# Patient Record
Sex: Male | Born: 1960
Health system: Southern US, Community
[De-identification: ages and names within clinical notes are randomized; demographics above are authoritative.]

## PROBLEM LIST (undated history)

## (undated) DIAGNOSIS — K219 Gastro-esophageal reflux disease without esophagitis: Secondary | ICD-10-CM

## (undated) DIAGNOSIS — M51379 Other intervertebral disc degeneration, lumbosacral region without mention of lumbar back pain or lower extremity pain: Secondary | ICD-10-CM

## (undated) DIAGNOSIS — I10 Essential (primary) hypertension: Secondary | ICD-10-CM

## (undated) DIAGNOSIS — F32A Depression, unspecified: Secondary | ICD-10-CM

## (undated) DIAGNOSIS — N471 Phimosis: Secondary | ICD-10-CM

## (undated) DIAGNOSIS — M1990 Unspecified osteoarthritis, unspecified site: Secondary | ICD-10-CM

## (undated) DIAGNOSIS — F419 Anxiety disorder, unspecified: Secondary | ICD-10-CM

## (undated) DIAGNOSIS — M5137 Other intervertebral disc degeneration, lumbosacral region: Secondary | ICD-10-CM

## (undated) DIAGNOSIS — K5730 Diverticulosis of large intestine without perforation or abscess without bleeding: Secondary | ICD-10-CM

## (undated) DIAGNOSIS — Z973 Presence of spectacles and contact lenses: Secondary | ICD-10-CM

## (undated) DIAGNOSIS — E119 Type 2 diabetes mellitus without complications: Secondary | ICD-10-CM

## (undated) DIAGNOSIS — E785 Hyperlipidemia, unspecified: Secondary | ICD-10-CM

## (undated) DIAGNOSIS — Z8673 Personal history of transient ischemic attack (TIA), and cerebral infarction without residual deficits: Secondary | ICD-10-CM

## (undated) HISTORY — DX: Gastro-esophageal reflux disease without esophagitis: K21.9

## (undated) HISTORY — DX: Anxiety disorder, unspecified: F41.9

## (undated) HISTORY — DX: Unspecified osteoarthritis, unspecified site: M19.90

---

## 1989-08-10 HISTORY — PX: OTHER SURGICAL HISTORY: SHX169

## 1990-08-10 HISTORY — PX: KNEE ARTHROSCOPY: SUR90

## 2004-08-10 DIAGNOSIS — S93409A Sprain of unspecified ligament of unspecified ankle, initial encounter: Secondary | ICD-10-CM | POA: Insufficient documentation

## 2006-07-06 ENCOUNTER — Emergency Department (HOSPITAL_COMMUNITY): Admission: EM | Admit: 2006-07-06 | Discharge: 2006-07-06 | Payer: Self-pay | Admitting: Family Medicine

## 2007-11-13 ENCOUNTER — Emergency Department (HOSPITAL_COMMUNITY): Admission: EM | Admit: 2007-11-13 | Discharge: 2007-11-13 | Payer: Self-pay | Admitting: Family Medicine

## 2008-05-03 ENCOUNTER — Ambulatory Visit: Payer: Self-pay | Admitting: Internal Medicine

## 2008-05-03 DIAGNOSIS — E1165 Type 2 diabetes mellitus with hyperglycemia: Secondary | ICD-10-CM

## 2008-05-03 DIAGNOSIS — I10 Essential (primary) hypertension: Secondary | ICD-10-CM | POA: Insufficient documentation

## 2008-05-03 LAB — CONVERTED CEMR LAB
Glucose, Urine, Semiquant: >=1000
Hgb A1c MFr Bld: 11.5 %
Ketones, urine, test strip: NEGATIVE
Protein, U semiquant: 100
Urobilinogen, UA: 0.2
WBC Urine, dipstick: NEGATIVE

## 2008-05-08 ENCOUNTER — Ambulatory Visit: Payer: Self-pay | Admitting: Internal Medicine

## 2008-05-13 LAB — CONVERTED CEMR LAB
Albumin: 4.6 g/dL (ref 3.5–5.2)
Alkaline Phosphatase: 80 units/L (ref 39–117)
BUN: 12 mg/dL (ref 6–23)
Chloride: 100 meq/L (ref 96–112)
Cholesterol: 275 mg/dL — ABNORMAL HIGH (ref 0–200)
Creatinine, Ser: 0.95 mg/dL (ref 0.40–1.50)
Glucose, Bld: 315 mg/dL — ABNORMAL HIGH (ref 70–99)
Potassium: 4.5 meq/L (ref 3.5–5.3)
Sodium: 139 meq/L (ref 135–145)
Total Bilirubin: 0.5 mg/dL (ref 0.3–1.2)

## 2008-05-17 ENCOUNTER — Ambulatory Visit: Payer: Self-pay | Admitting: Internal Medicine

## 2008-05-17 LAB — CONVERTED CEMR LAB
BUN: 10 mg/dL (ref 6–23)
CO2: 23 meq/L (ref 19–32)
Calcium: 9.2 mg/dL (ref 8.4–10.5)
Creatinine, Ser: 0.86 mg/dL (ref 0.40–1.50)
Glucose, Bld: 319 mg/dL — ABNORMAL HIGH (ref 70–99)
Potassium: 4.4 meq/L (ref 3.5–5.3)

## 2008-05-31 ENCOUNTER — Ambulatory Visit: Payer: Self-pay | Admitting: Nurse Practitioner

## 2008-09-07 ENCOUNTER — Ambulatory Visit: Payer: Self-pay | Admitting: Internal Medicine

## 2008-09-11 ENCOUNTER — Ambulatory Visit: Payer: Self-pay | Admitting: Internal Medicine

## 2008-09-11 LAB — CONVERTED CEMR LAB
Protein, U semiquant: 100
Specific Gravity, Urine: 1.02
WBC Urine, dipstick: NEGATIVE
pH: 5.5

## 2008-09-12 ENCOUNTER — Encounter (INDEPENDENT_AMBULATORY_CARE_PROVIDER_SITE_OTHER): Payer: Self-pay | Admitting: Internal Medicine

## 2008-09-24 LAB — CONVERTED CEMR LAB
ALT: 97 units/L — ABNORMAL HIGH (ref 0–53)
Albumin: 4.6 g/dL (ref 3.5–5.2)
Basophils Absolute: 0 10*3/uL (ref 0.0–0.1)
CO2: 21 meq/L (ref 19–32)
HDL: 48 mg/dL (ref >39)
Hemoglobin: 16.1 g/dL (ref 13.0–17.0)
Lymphocytes Relative: 32 % (ref 12–46)
MCHC: 32.7 g/dL (ref 30.0–36.0)
MCV: 95.2 fL (ref 78.0–100.0)
Monocytes Absolute: 0.5 10*3/uL (ref 0.1–1.0)
Neutrophils Relative %: 58 % (ref 43–77)
Potassium: 5 meq/L (ref 3.5–5.3)
RBC: 5.17 M/uL (ref 4.22–5.81)
RDW: 12.3 % (ref 11.5–15.5)
Sodium: 138 meq/L (ref 135–145)
Total Bilirubin: 0.8 mg/dL (ref 0.3–1.2)
Total CHOL/HDL Ratio: 7.6
Total Protein: 8.1 g/dL (ref 6.0–8.3)
WBC: 6 10*3/uL (ref 4.0–10.5)

## 2008-10-23 ENCOUNTER — Ambulatory Visit: Payer: Self-pay | Admitting: Internal Medicine

## 2008-10-23 DIAGNOSIS — Z8639 Personal history of other endocrine, nutritional and metabolic disease: Secondary | ICD-10-CM

## 2008-10-23 DIAGNOSIS — Z862 Personal history of diseases of the blood and blood-forming organs and certain disorders involving the immune mechanism: Secondary | ICD-10-CM | POA: Insufficient documentation

## 2008-10-23 DIAGNOSIS — E782 Mixed hyperlipidemia: Secondary | ICD-10-CM | POA: Insufficient documentation

## 2008-10-23 LAB — CONVERTED CEMR LAB
Blood Glucose, Fingerstick: 394
OCCULT 1: NEGATIVE
OCCULT 2: NEGATIVE
OCCULT 3: NEGATIVE

## 2009-02-12 ENCOUNTER — Ambulatory Visit: Payer: Self-pay | Admitting: Internal Medicine

## 2009-02-12 LAB — CONVERTED CEMR LAB: Blood Glucose, AC Bkfst: 364 mg/dL

## 2009-02-19 ENCOUNTER — Encounter (INDEPENDENT_AMBULATORY_CARE_PROVIDER_SITE_OTHER): Payer: Self-pay | Admitting: Internal Medicine

## 2009-02-20 ENCOUNTER — Encounter (INDEPENDENT_AMBULATORY_CARE_PROVIDER_SITE_OTHER): Payer: Self-pay | Admitting: Internal Medicine

## 2009-03-07 ENCOUNTER — Encounter (INDEPENDENT_AMBULATORY_CARE_PROVIDER_SITE_OTHER): Payer: Self-pay | Admitting: Internal Medicine

## 2009-03-07 ENCOUNTER — Ambulatory Visit: Payer: Self-pay | Admitting: Internal Medicine

## 2009-03-11 ENCOUNTER — Ambulatory Visit (HOSPITAL_COMMUNITY): Admission: RE | Admit: 2009-03-11 | Discharge: 2009-03-11 | Payer: Self-pay | Admitting: Internal Medicine

## 2009-03-31 DIAGNOSIS — M1610 Unilateral primary osteoarthritis, unspecified hip: Secondary | ICD-10-CM | POA: Insufficient documentation

## 2009-03-31 DIAGNOSIS — M169 Osteoarthritis of hip, unspecified: Secondary | ICD-10-CM | POA: Insufficient documentation

## 2009-04-17 ENCOUNTER — Encounter (INDEPENDENT_AMBULATORY_CARE_PROVIDER_SITE_OTHER): Payer: Self-pay | Admitting: *Deleted

## 2009-06-19 ENCOUNTER — Ambulatory Visit: Payer: Self-pay | Admitting: *Deleted

## 2009-09-02 ENCOUNTER — Telehealth (INDEPENDENT_AMBULATORY_CARE_PROVIDER_SITE_OTHER): Payer: Self-pay | Admitting: Internal Medicine

## 2009-09-06 ENCOUNTER — Ambulatory Visit: Payer: Self-pay | Admitting: Internal Medicine

## 2009-09-06 DIAGNOSIS — F1010 Alcohol abuse, uncomplicated: Secondary | ICD-10-CM | POA: Insufficient documentation

## 2009-09-06 LAB — CONVERTED CEMR LAB: Blood Glucose, Fingerstick: 154

## 2009-09-09 ENCOUNTER — Encounter (INDEPENDENT_AMBULATORY_CARE_PROVIDER_SITE_OTHER): Payer: Self-pay | Admitting: Internal Medicine

## 2009-09-20 ENCOUNTER — Ambulatory Visit: Payer: Self-pay | Admitting: Internal Medicine

## 2009-09-25 ENCOUNTER — Ambulatory Visit: Payer: Self-pay | Admitting: Physician Assistant

## 2009-10-12 ENCOUNTER — Encounter (INDEPENDENT_AMBULATORY_CARE_PROVIDER_SITE_OTHER): Payer: Self-pay | Admitting: Internal Medicine

## 2009-10-12 LAB — CONVERTED CEMR LAB
ALT: 54 units/L — ABNORMAL HIGH (ref 0–53)
Albumin: 4.6 g/dL (ref 3.5–5.2)
Alkaline Phosphatase: 59 units/L (ref 39–117)
Bilirubin, Direct: 0.2 mg/dL (ref 0.0–0.3)
Cholesterol: 189 mg/dL (ref 0–200)
HDL: 47 mg/dL (ref >39)
LDL Cholesterol: 118 mg/dL — ABNORMAL HIGH (ref 0–99)
Total Bilirubin: 0.8 mg/dL (ref 0.3–1.2)
Total Protein: 8 g/dL (ref 6.0–8.3)
VLDL: 24 mg/dL (ref 0–40)

## 2009-11-12 ENCOUNTER — Ambulatory Visit: Payer: Self-pay | Admitting: Internal Medicine

## 2009-11-12 DIAGNOSIS — M2550 Pain in unspecified joint: Secondary | ICD-10-CM

## 2009-11-15 ENCOUNTER — Encounter (INDEPENDENT_AMBULATORY_CARE_PROVIDER_SITE_OTHER): Payer: Self-pay | Admitting: Internal Medicine

## 2009-11-15 LAB — CONVERTED CEMR LAB
Anti Nuclear Antibody(ANA): POSITIVE — AB
Rhuematoid fact SerPl-aCnc: 33 intl units/mL — ABNORMAL HIGH (ref 0–20)

## 2009-11-18 ENCOUNTER — Ambulatory Visit (HOSPITAL_COMMUNITY): Admission: RE | Admit: 2009-11-18 | Discharge: 2009-11-18 | Payer: Self-pay | Admitting: Internal Medicine

## 2009-11-21 ENCOUNTER — Encounter (INDEPENDENT_AMBULATORY_CARE_PROVIDER_SITE_OTHER): Payer: Self-pay | Admitting: Internal Medicine

## 2009-11-21 ENCOUNTER — Telehealth (INDEPENDENT_AMBULATORY_CARE_PROVIDER_SITE_OTHER): Payer: Self-pay | Admitting: Internal Medicine

## 2009-11-23 ENCOUNTER — Encounter (INDEPENDENT_AMBULATORY_CARE_PROVIDER_SITE_OTHER): Payer: Self-pay | Admitting: Internal Medicine

## 2009-11-23 ENCOUNTER — Telehealth (INDEPENDENT_AMBULATORY_CARE_PROVIDER_SITE_OTHER): Payer: Self-pay | Admitting: Internal Medicine

## 2009-11-23 DIAGNOSIS — D169 Benign neoplasm of bone and articular cartilage, unspecified: Secondary | ICD-10-CM | POA: Insufficient documentation

## 2009-11-23 LAB — CONVERTED CEMR LAB: Scleroderma (Scl-70) (ENA) Antibody, IgG: <0.2 (ref <1.0)

## 2009-12-11 ENCOUNTER — Encounter (INDEPENDENT_AMBULATORY_CARE_PROVIDER_SITE_OTHER): Payer: Self-pay | Admitting: Internal Medicine

## 2009-12-31 ENCOUNTER — Ambulatory Visit: Payer: Self-pay | Admitting: Internal Medicine

## 2009-12-31 DIAGNOSIS — M1710 Unilateral primary osteoarthritis, unspecified knee: Secondary | ICD-10-CM

## 2010-01-04 ENCOUNTER — Emergency Department (HOSPITAL_COMMUNITY): Admission: EM | Admit: 2010-01-04 | Discharge: 2010-01-04 | Payer: Self-pay | Admitting: Family Medicine

## 2010-02-07 ENCOUNTER — Ambulatory Visit: Payer: Self-pay | Admitting: Internal Medicine

## 2010-02-07 ENCOUNTER — Telehealth (INDEPENDENT_AMBULATORY_CARE_PROVIDER_SITE_OTHER): Payer: Self-pay | Admitting: Internal Medicine

## 2010-02-14 ENCOUNTER — Telehealth (INDEPENDENT_AMBULATORY_CARE_PROVIDER_SITE_OTHER): Payer: Self-pay | Admitting: Internal Medicine

## 2010-03-02 ENCOUNTER — Encounter (INDEPENDENT_AMBULATORY_CARE_PROVIDER_SITE_OTHER): Payer: Self-pay | Admitting: Internal Medicine

## 2010-03-08 ENCOUNTER — Encounter (INDEPENDENT_AMBULATORY_CARE_PROVIDER_SITE_OTHER): Payer: Self-pay | Admitting: Internal Medicine

## 2010-03-10 ENCOUNTER — Telehealth (INDEPENDENT_AMBULATORY_CARE_PROVIDER_SITE_OTHER): Payer: Self-pay | Admitting: *Deleted

## 2010-03-10 ENCOUNTER — Encounter (INDEPENDENT_AMBULATORY_CARE_PROVIDER_SITE_OTHER): Payer: Self-pay | Admitting: Internal Medicine

## 2010-03-27 ENCOUNTER — Ambulatory Visit (HOSPITAL_COMMUNITY): Admission: RE | Admit: 2010-03-27 | Discharge: 2010-03-27 | Payer: Self-pay | Admitting: Internal Medicine

## 2010-04-02 ENCOUNTER — Encounter (INDEPENDENT_AMBULATORY_CARE_PROVIDER_SITE_OTHER): Payer: Self-pay | Admitting: Internal Medicine

## 2010-05-09 ENCOUNTER — Encounter (INDEPENDENT_AMBULATORY_CARE_PROVIDER_SITE_OTHER): Payer: Self-pay | Admitting: Internal Medicine

## 2010-05-26 ENCOUNTER — Encounter (INDEPENDENT_AMBULATORY_CARE_PROVIDER_SITE_OTHER): Payer: Self-pay | Admitting: Internal Medicine

## 2010-09-09 NOTE — Assessment & Plan Note (Signed)
Summary: bad hip/knee pain /tmm   Vital Signs:  Patient profile:   50 year old male Weight:      172 pounds BMI:     27.86 Temp:     98.4 degrees F Pulse rate:   126 / minute Pulse rhythm:   regular Resp:     20 per minute BP sitting:   139 / 95  (left arm) Cuff size:   regular  Vitals Entered By: Vesta Mixer CMA (November 12, 2009 2:22 PM) CC: Bad left knee and lower back pain for about 2 weeks now. Is Patient Diabetic? Yes Pain Assessment Patient in pain? yes     Location: left knee/low back Intensity: 10 CBG Result 191  Does patient need assistance? Ambulation Impaired:Risk for fall   CC:  Bad left knee and lower back pain for about 2 weeks now..  History of Present Illness: 1.  Pt. with chronic right inguinal/hip pain.  Developed left groin pain about 1 month ago, followed by bilateral low back pain.  In past 2 weeks, also with bilateral knee pain.  Pt. with history of right distal femur area pain--has had spurs removed in past per patient.  Pt. feels all of the joints of separate pain--very sharp.  Pain is always there.  Worsens at times--with ambulation or standing.  Has not noted any swelling, redness or heat of involved joints.  No other joints that he can say are involved.  Tramadol not really helping.  Is drinking alcohol, but states 2 shots of liquor daily.  Denies overdoing any activity or injury to specific joints.  No definite morning stiffness.  Allergies: No Known Drug Allergies  Physical Exam  General:  Pt. shaking in apparent pain when gets up and down from exam table. Msk:  Tender over lumbosacral spinous processes more so than paraspinous musculature, though the latter is tender as well.  Extremities:  Able to full extend knees, but difficulty with flexion beyond 90 degrees  L>>R knee.  No effusion palpated in either knee.  Left knee with tenderness more so over medial joint surface and with increased pain on stress of medial collateral ligament.  Left  knee, however, with fairly global tenderness.   No erythema Right hip with some improvement of pain with external rotation--not so on the left--has increased pain with any motion. Neurologic:  Motor strength of LE appears 5/5 though exam limited secondary to fairly diffuse joint discomfort in lower limbs.     Impression & Recommendations:  Problem # 1:  PAIN IN JOINT, LOW BACK, HIPS AND KNEES (ICD-719.49)  No objective findings on exam today--pt. appears to be in severe pain.  Orders: Diagnostic X-Ray/Fluoroscopy (Diagnostic X-Ray/Flu) T-Rheumatoid Factor [phone]) T-Sed Rate (Automated) [phone]) T-Antinuclear Antib (ANA) [phone])  Complete Medication List: 1)  Glucometer Elite Classic Kit (Blood glucose monitoring suppl) .... Test blood sugar twice daily 2)  Glucometer Elite Test Strp (Glucose blood) .... Two times a day blood sugar checks 3)  Lisinopril 10 Mg Tabs (Lisinopril) .Marland Kitchen.. 1 tab by mouth daily 4)  Toprol Xl 50 Mg Xr24h-tab (Metoprolol succinate) .Marland Kitchen.. 1 tab by mouth daily 5)  Tramadol Hcl 50 Mg Tabs (Tramadol hcl) .Marland Kitchen.. 1 tab by mouth two times a day as needed for pain- may refill only every 30 days 6)  Glucotrol Xl 5 Mg Xr24h-tab (Glipizide) .Marland Kitchen.. 1 tab by mouth daily in morning 7)  Metformin Hcl 1000 Mg Tabs (Metformin hcl) .Marland Kitchen.. 1 tab by mouth two times a day 8)  Lovaza 1 Gm Caps (Omega-3-acid ethyl esters) .... 4 caps by mouth daily 9)  Lipitor 20 Mg Tabs (Atorvastatin calcium) .Marland Kitchen.. 1 tab by mouth daily 10)  Hydrocodone-acetaminophen 5-500 Mg Tabs (Hydrocodone-acetaminophen) .Marland Kitchen.. 1-2 tabs by mouth every 4-6 hours as needed for pain.  Other Orders: Capillary Blood Glucose/CBG (24401)  Patient Instructions: 1)  Follow up with Dr. Delrae Alfred in 1 month --joint pain Prescriptions: HYDROCODONE-ACETAMINOPHEN 5-500 MG TABS (HYDROCODONE-ACETAMINOPHEN) 1-2 tabs by mouth every 4-6 hours as needed for pain.  #30 x 0   Entered and Authorized by:   Julieanne Manson  MD   Signed by:   Julieanne Manson MD on 11/12/2009   Method used:   Print then Give to Patient   RxID:   [id number]

## 2010-09-09 NOTE — Assessment & Plan Note (Signed)
Summary: DM CHECK////KT   Vital Signs:  Patient profile:   50 year old male Weight:      177 pounds Pulse rate:   88 / minute Pulse rhythm:   regular Resp:     20 per minute BP sitting:   142 / 94  (left arm) Cuff size:   large  Vitals Entered By: Vesta Mixer CMA (September 06, 2009 9:56 AM) CC: diabetes/htn check.  No bottle for Lovaza Is Patient Diabetic? Yes Pain Assessment Patient in pain? yes     Location: left hip/feet Intensity: 9 CBG Result 154  Does patient need assistance? Ambulation Normal    CC:  diabetes/htn check.  No bottle for Lovaza.  History of Present Illness: 1.  DM:  Not checking blood sugars at home.  Has a glucometer, but not checking.  Pt. does feel he can improve with diet and exercise to bring his lowest A1C since I've been following to below 7.0%.  Today he is 7.5%.  Has lost about 10 lbs.  Stopped drinking alcohol for about 1 1/2 months, but started back up again recently.  Not drinking beer any longer, but is drinking about 3 shots of Whiskey daily.  Drinks with his girlfriend.  Would be willing to go to a counselor.  May have intermittent problems with depression.  Not checking feet nightly.  Burning in feet mainly at night.  Has had Retasure--not in chart currently  2.  Right hip pain:  Discussed no significant findings on the xray.  Pt. then states had an MRI done of hip in 2008 at [facility].  Pt. points to high inguinal ligament area as source of pain.  No radiation.  When walks, feels like he is being stabbed there.   Did have PT without improvement previously--went to Dr. Carlean Jews, ortho previously.  Pt. states he was told he had hip arthritis, but Xrays do not support this.  Have not seen his ortho records yet.  3.  Hypertension:  Taking meds regularly, which appears to be the case based on his recent refill history.   Taking 1/2 of a 50 mg Toprol Xl and 10 mg of Lisnopril.   4.  Hyperlipidemia:  Has not received Lovaza yet, not  clear if has not finished ICP papers or if pt. just has not picked up, but is picking up all other meds appropriately.  Called pharmacy--the Lovaza has been there for him since March of 2010, but apparently, pt. was not notified  Allergies (verified): No Known Drug Allergies  Physical Exam  General:  NAD Lungs:  Normal respiratory effort, chest expands symmetrically. Lungs are clear to auscultation, no crackles or wheezes. Heart:  Normal rate and regular rhythm. S1 and S2 normal without gallop, murmur, click, rub or other extra sounds.  Radial pulses normodynamic and equal. Extremities:  Mild callousing around back edge of heel  L>>R Neurologic:  Normal gait  Diabetes Management Exam:    Foot Exam (with socks and/or shoes not present):       Sensory-Monofilament:          Left foot: normal          Right foot: normal   Impression & Recommendations:  Problem # 1:  HYPERLIPIDEMIA, MIXED (ICD-272.2) Pt to pick up Lovaza and take both meds for 8 weeks, then return for fasting labs His updated medication list for this problem includes:    Lovaza 1 Gm Caps (Omega-3-acid ethyl esters) .Marland KitchenMarland KitchenMarland KitchenMarland Kitchen 4 caps by mouth daily  Pravastatin Sodium 40 Mg Tabs (Pravastatin sodium) .Marland Kitchen..Marland Kitchen Two tablets by mouth nightly for cholesterol **needs lab visit**  Problem # 2:  DEGENERATIVE JOINT DISEASE, RIGHT HIP (ICD-715.95) Not found on Xray of hip--calling [facility] for [facility] MRI--showed an ovla lesion in the anterior roof of the right acetabulum consistent with a cyst.  CT recommended, but pt. states not done.   Will look into old records and see if ortho records are indeed here. His updated medication list for this problem includes:    Tramadol Hcl 50 Mg Tabs (Tramadol hcl) .Marland Kitchen... 1 tab by mouth two times a day as needed for pain- may refill only every 30 days  Problem # 3:  ESSENTIAL HYPERTENSION (ICD-401.9) Not controlled  INcrease Toprol to 50 mg daily His updated medication list for this  problem includes:    Lisinopril 10 Mg Tabs (Lisinopril) .Marland Kitchen... 1 tab by mouth daily    Toprol Xl 50 Mg Xr24h-tab (Metoprolol succinate) .Marland Kitchen... 1 tab by mouth daily  Problem # 4:  DIABETES MELLITUS, TYPE II, UNCONTROLLED (ICD-250.02)  Much improved control--much of this may be from decrease in alcohol intake until recently Encouraged continued improvement in lifestyle His updated medication list for this problem includes:    Lisinopril 10 Mg Tabs (Lisinopril) .Marland Kitchen... 1 tab by mouth daily    Glucotrol Xl 5 Mg Xr24h-tab (Glipizide) .Marland Kitchen... 1 tab by mouth daily in morning    Metformin Hcl 1000 Mg Tabs (Metformin hcl) .Marland Kitchen... 1 tab by mouth two times a day  Orders: Hgb A1C (16109UE)  Problem # 5:  ALCOHOL ABUSE (ICD-305.00)  Orders: Psychology Referral (Psychology)  [place]  Complete Medication List: 1)  Glucometer Elite Classic Kit (Blood glucose monitoring suppl) .... Test blood sugar twice daily 2)  Glucometer Elite Test Strp (Glucose blood) .... Two times a day blood sugar checks 3)  Lisinopril 10 Mg Tabs (Lisinopril) .Marland Kitchen.. 1 tab by mouth daily 4)  Toprol Xl 50 Mg Xr24h-tab (Metoprolol succinate) .Marland Kitchen.. 1 tab by mouth daily 5)  Tramadol Hcl 50 Mg Tabs (Tramadol hcl) .Marland Kitchen.. 1 tab by mouth two times a day as needed for pain- may refill only every 30 days 6)  Glucotrol Xl 5 Mg Xr24h-tab (Glipizide) .Marland Kitchen.. 1 tab by mouth daily in morning 7)  Metformin Hcl 1000 Mg Tabs (Metformin hcl) .Marland Kitchen.. 1 tab by mouth two times a day 8)  Lovaza 1 Gm Caps (Omega-3-acid ethyl esters) .... 4 caps by mouth daily 9)  Pravastatin Sodium 40 Mg Tabs (Pravastatin sodium) .... Two tablets by mouth nightly for cholesterol **needs lab visit**  Other Orders: Capillary Blood Glucose/CBG (45409) Flu Vaccine 8yrs + (81191) Admin 1st Vaccine (47829) Admin 1st Vaccine [facility]) [phone]) Pneumococcal Vaccine (86578) Admin of Any Addtl Vaccine (46962) Admin of Any Addtl Vaccine (State) [phone])  Patient Instructions: 1)   Release of info from what used to be [facility]--need any radiologic evaluation of the right or left hip.-done 2)  Follow up with Dr. Delrae Alfred in 3 months --hip pain, DM, cholesterol, htn 3)  Fasting labs 2 days before visit with Dr. Delrae Alfred in 3 months--FLP, CMET, urine microalbumin, A1C, CBC 4)  BP check in 2 weeks--nurse visit. Prescriptions: TRAMADOL HCL 50 MG TABS (TRAMADOL HCL) 1 tab by mouth two times a day as needed for pain- may refill only every 30 days  #60 x 2   Entered and Authorized by:   Julieanne Manson MD   Signed by:   Julieanne Manson MD on 09/06/2009  Method used:   Print then Give to Patient   RxID:   [id number] LISINOPRIL 10 MG TABS (LISINOPRIL) 1 tab by mouth daily  #30 x 11   Entered and Authorized by:   Julieanne Manson MD   Signed by:   Julieanne Manson MD on 09/06/2009   Method used:   Faxed to ...       [facility] - [facility] (retail)       [address] [place], [place]  [address]       Ph: [phone]       Fax: [phone]   RxID:   [id number] GLUCOTROL XL 5 MG XR24H-TAB (GLIPIZIDE) 1 tab by mouth daily in morning  #30 x 11   Entered and Authorized by:   Julieanne Manson MD   Signed by:   Julieanne Manson MD on 09/06/2009   Method used:   Faxed to ...       [facility] - [facility] (retail)       [address] [place], [place]  [address]       Ph: [phone]       Fax: [phone]   RxID:   [id number] PRAVASTATIN SODIUM 40 MG TABS (PRAVASTATIN SODIUM) Two tablets by mouth nightly for cholesterol **Needs lab visit**  #60 x 0   Entered and Authorized by:   Julieanne Manson MD   Signed by:   Julieanne Manson MD on 09/06/2009   Method used:   Faxed to ...       [facility] - [facility] (retail)       [address] [place], [place]  [address]       Ph: [phone]       Fax: [phone]   RxID:    [id number] LOVAZA 1 GM CAPS (OMEGA-3-ACID ETHYL ESTERS) 4 caps by mouth daily  #120 x 11   Entered and Authorized by:   Julieanne Manson MD   Signed by:   Julieanne Manson MD on 09/06/2009   Method used:   Faxed to ...       [facility] - Pharmac (retail)       [address] [place], [place]  [address]       Ph: [phone]       Fax: [phone]   RxID:   [id number] METFORMIN HCL 1000 MG TABS (METFORMIN HCL) 1 tab by mouth two times a day  #60 x 11   Entered and Authorized by:   Julieanne Manson MD   Signed by:   Julieanne Manson MD on 09/06/2009   Method used:   Faxed to ...       [facility] - Pharmac (retail)       [address] [place], [place]  [address]       Ph: [phone]       Fax: [phone]   RxID:   [id number] GLUCOMETER ELITE TEST  STRP (GLUCOSE BLOOD) two times a day blood sugar checks  #100 x 11   Entered and Authorized by:   Julieanne Manson MD   Signed by:   Julieanne Manson MD on 09/06/2009   Method used:   Faxed to ...       [facility] - Pharmac (retail)  [address] [place], [place]  [address]       Ph: [phone]       Fax: [phone]   RxID:   [id number] TOPROL XL 50 MG XR24H-TAB (METOPROLOL SUCCINATE) 1 tab by mouth daily  #30 x 11   Entered and Authorized by:   Julieanne Manson MD   Signed by:   Julieanne Manson MD on 09/06/2009   Method used:   Faxed to ...       [facility] - Pharmac (retail)       [address] [place], [place]  [address]       Ph: [phone]       Fax: [phone]   RxID:   [id number]   Last LDL:                                                 See Comment mg/dL (02/07/1600 09:32:35 PM)            Diabetic Foot Exam Last Podiatry Exam Date: 02/12/2009  Set Next Diabetic Foot Exam here: 09/05/2010   10-g (5.07)  Semmes-Weinstein Monofilament Test Performed by: Vesta Mixer CMA          Right Foot          Left Foot Visual Inspection               Test Control      normal         normal Site 1         normal         normal Site 2         normal         normal Site 3         normal         normal Site 4         normal         normal Site 5         normal         normal Site 6         normal         normal Site 7         normal         normal Site 8         normal         normal Site 9         normal         normal Site 10         normal         normal  Impression      normal         normal    Vital Signs:  Patient profile:   50 year old male Weight:      177 pounds Pulse rate:   88 / minute Pulse rhythm:   regular Resp:     20 per minute BP sitting:   142 / 94  (left arm) Cuff size:   large  Vitals Entered By: Vesta Mixer CMA (September 06, 2009 9:56 AM)    Pneumovax Vaccine    Vaccine Type: Pneumovax    Site: right deltoid    Mfr: Merck  Dose: 0.5 ml    Route: IM    Given by: Vesta Mixer CMA    Exp. Date: 09/06/2010    Lot #: [id number]    VIS given: 03/07/96 version given September 06, 2009.  Influenza Vaccine    Vaccine Type: Fluvax 3+    Site: left deltoid    Mfr: Sanofi Pasteur    Dose: 0.5 ml    Route: IM    Given by: Vesta Mixer CMA    Exp. Date: 02/06/2010    Lot #: [id number]    VIS given: 03/03/07 version given September 06, 2009.  Flu Vaccine Consent Questions    Do you have a history of severe allergic reactions to this vaccine? no    Any prior history of allergic reactions to egg and/or gelatin? no    Do you have a sensitivity to the preservative Thimersol? no    Do you have a past history of Guillan-Barre Syndrome? no    Do you currently have an acute febrile illness? no    Have you ever had a severe reaction to latex? no    Vaccine information given and explained to patient? yes

## 2010-09-09 NOTE — Assessment & Plan Note (Signed)
Summary: KNEE PAIN/ GK   Vital Signs:  Patient profile:   50 year old male Weight:      163 pounds Temp:     97.9 degrees F Pulse rate:   106 / minute Pulse rhythm:   regular Resp:     18 per minute BP sitting:   136 / 94  (left arm) Cuff size:   regular  Vitals Entered By: Vesta Mixer CMA (February 07, 2010 12:30 PM) CC: Terrible left knee pain/ bilat hips Is Patient Diabetic? Yes Pain Assessment Patient in pain? yes     Location: left knee Intensity: 10 CBG Result 305  Does patient need assistance? Ambulation Impaired:Risk for fall   CC:  Terrible left knee pain/ bilat hips.  History of Present Illness: 1.  Joint Pain:  Pt. never received any information regarding referral to ortho at [facility] able to see if actually went beyond sending referral to Ortho.  Pt. has been moving and with the increased use of joints, having increased pain.  Just very tired of the pain.  Knee injection did not offer any improvement in pain at last visit.  Would be willing to go to [facility] program if not able to get in at [facility].  States Tramadol and Hydrocodone have not been helpful in decreasing pain.  Pt. states has taken Oxycontin before, but unable to afford now--only thing that has worked for him in the past.  2.  Panic Attacks:  2 episodes 2 weeks ago.  Went to an Urgent Care--pt not sure where it was.  Pt. states he was just sitting in backyard of daughter's home and started shaking, could hardly speak, was crying.  Cannot recall palpitations or dyspnea associated.  Was given unknown shot and pills. Did not bring any of the information or pills with him.  Was only given 3 pills.  Pt. states he took all 3 pills since--but has not had another panic attack.  Has occurred 4 times thus far.  First episode was this year.   3.  DM:  not checking sugars.  High today.  Has a glucometer, but pt. not sure if working--gets an "E"  for error he believes every time he uses.   4.   Hyperlipidemia:  Is fasting today, states he is taking meds including Lovaza regularly--however, it appears pt. has not picked up meds for past 4 months, which he denies--states he called in meds 2 weeks ago, but has not picked up.    Allergies (verified): No Known Drug Allergies  Physical Exam  General:  NAD Lungs:  Normal respiratory effort, chest expands symmetrically. Lungs are clear to auscultation, no crackles or wheezes. Heart:  Normal rate and regular rhythm. S1 and S2 normal without gallop, murmur, click, rub or other extra sounds. Extremities:  Limps, favoring left leg--Tender along left knee joint line--no effusion.  Appears to have full ROM   Impression & Recommendations:  Problem # 1:  DEGENERATIVE JOINT DISEASE, LEFT KNEE (ICD-715.96) As well as hips and right knee ONe time Rx for Oxycodone. Discussed with pt. will call pharmacy and have them look at records and see if indeed he has not filled prescriptions since 2/28.  Discussed if he is not being truthful, then I will have a difficult time determining if he is being truthful with pain in the future. Will check on Ortho referral--if nothing in the works, refer to PT with Sports Medicine referral through them. His updated medication list for this problem  includes:    Tramadol Hcl 50 Mg Tabs (Tramadol hcl) .Marland Kitchen... 1 tab by mouth two times a day as needed for pain- may refill only every 30 days    Hydrocodone-acetaminophen 5-500 Mg Tabs (Hydrocodone-acetaminophen) .Marland Kitchen... 1-2 tabs by mouth every 4-6 hours as needed for pain.    Oxycodone-acetaminophen 5-325 Mg Tabs (Oxycodone-acetaminophen) .Marland Kitchen... 1 tab by mouth every 6 hours as needed for pain  no refills  Problem # 2:  ESSENTIAL HYPERTENSION (ICD-401.9) Not controlled. Off meds probably for 4 months, possibly for just 2 weeks His updated medication list for this problem includes:    Lisinopril 10 Mg Tabs (Lisinopril) .Marland Kitchen... 1 tab by mouth daily    Toprol Xl 50 Mg Xr24h-tab  (Metoprolol succinate) .Marland Kitchen... 1 tab by mouth daily  Problem # 3:  HYPERLIPIDEMIA, MIXED (ICD-272.2) Need to have him on meds for 6 weeks before checking FLP again His updated medication list for this problem includes:    Lovaza 1 Gm Caps (Omega-3-acid ethyl esters) .Marland KitchenMarland KitchenMarland KitchenMarland Kitchen 4 caps by mouth daily    Lipitor 20 Mg Tabs (Atorvastatin calcium) .Marland Kitchen... 1 tab by mouth daily  Problem # 4:  DIABETES MELLITUS, TYPE II, UNCONTROLLED (ICD-250.02) As above--not on meds His updated medication list for this problem includes:    Lisinopril 10 Mg Tabs (Lisinopril) .Marland Kitchen... 1 tab by mouth daily    Glucotrol Xl 5 Mg Xr24h-tab (Glipizide) .Marland Kitchen... 1 tab by mouth daily in morning    Metformin Hcl 1000 Mg Tabs (Metformin hcl) .Marland Kitchen... 1 tab by mouth two times a day  Complete Medication List: 1)  Glucometer Elite Classic Kit (Blood glucose monitoring suppl) .... Test blood sugar twice daily 2)  Glucometer Elite Test Strp (Glucose blood) .... Two times a day blood sugar checks 3)  Lisinopril 10 Mg Tabs (Lisinopril) .Marland Kitchen.. 1 tab by mouth daily 4)  Toprol Xl 50 Mg Xr24h-tab (Metoprolol succinate) .Marland Kitchen.. 1 tab by mouth daily 5)  Tramadol Hcl 50 Mg Tabs (Tramadol hcl) .Marland Kitchen.. 1 tab by mouth two times a day as needed for pain- may refill only every 30 days 6)  Glucotrol Xl 5 Mg Xr24h-tab (Glipizide) .Marland Kitchen.. 1 tab by mouth daily in morning 7)  Metformin Hcl 1000 Mg Tabs (Metformin hcl) .Marland Kitchen.. 1 tab by mouth two times a day 8)  Lovaza 1 Gm Caps (Omega-3-acid ethyl esters) .... 4 caps by mouth daily 9)  Lipitor 20 Mg Tabs (Atorvastatin calcium) .Marland Kitchen.. 1 tab by mouth daily 10)  Hydrocodone-acetaminophen 5-500 Mg Tabs (Hydrocodone-acetaminophen) .Marland Kitchen.. 1-2 tabs by mouth every 4-6 hours as needed for pain. 11)  Oxycodone-acetaminophen 5-325 Mg Tabs (Oxycodone-acetaminophen) .Marland Kitchen.. 1 tab by mouth every 6 hours as needed for pain  no refills  Other Orders: Capillary Blood Glucose/CBG (10272) Prescriptions: OXYCODONE-ACETAMINOPHEN 5-325 MG TABS  (OXYCODONE-ACETAMINOPHEN) 1 tab by mouth every 6 hours as needed for pain  No refills  #30 x 0   Entered and Authorized by:   Julieanne Manson MD   Signed by:   Julieanne Manson MD on 02/07/2010   Method used:   Print then Give to Patient   RxID:   [id number]   Appended Document: KNEE PAIN/ GK Confirmed with [facility] that pt. has not picked up any meds since 10/08/09--Metformin only.  The rest of his meds picked up last prior to that date.  Tiffany--please call pt. and find out why he is not picking up his meds and to get his meds called in immediately so he can pick up soon.  He apparently  did not pick up after he was seen last week.  Appended Document: KNEE PAIN/ GK pt states he did not have money and he was moving and had no way up there, so that is why he did not pick up his meds for so long.

## 2010-09-09 NOTE — Letter (Signed)
[facility]   Imported By: Arta Bruce 10/31/2009 10:43:25  _____________________________________________________________________  External Attachment:    Type:   Image     Comment:   External Document

## 2010-09-09 NOTE — Letter (Signed)
Summary: [facility]//APPT DATE & TIME  [facility]//APPT DATE & TIME   Imported By: Arta Bruce 02/06/2010 12:32:53  _____________________________________________________________________  External Attachment:    Type:   Image     Comment:   External Document

## 2010-09-09 NOTE — Letter (Signed)
Summary: [facility]DS [facility]//PICKED UP  REQUESTED [facility]ORDS FOR [facility]ELF//PICKED UP   Imported By: Arta Bruce 04/03/2010 15:29:22  _____________________________________________________________________  External Attachment:    Type:   Image     Comment:   External Document

## 2010-09-09 NOTE — Progress Notes (Signed)
Summary: xray results.  Phone Note Call from Patient   Summary of Call: Samuel Cooke needs to know his x-rays results from his knee because it is hurtin very bad.  Please call him back as soon as  possible. Sherria Riemann MD   Initial call taken by: Manon Hilding,  November 21, 2009 2:38 PM  Follow-up for Phone Call        Results are back will send to Dr. Delrae Alfred for review. Follow-up by: Vesta Mixer CMA,  November 21, 2009 2:55 PM  Additional Follow-up for Phone Call Additional follow up Details #1::        Called and spoke with pt. regarding xray suggesting Hereditary Multiple osteochondromas.  Also discussed mildly elevated RF, ANA with normal ENA panel. Do not know if osteochondromas causing his pain--does not sound like they involve his joint surface.   Would like to have ortho evaluate pt. for the hereditary entity. Please schedule pt. to come in for repeat RF and anticyclic citrullinated peptide antibody (for elevated RF) Also please schedule for a knee injection with me in near future.  30 minutes Pt. has not yet filled hydrocodone or refilled Tramadol.  Encouraged him to take hydrocodone to [facility] pharmacy Additional Follow-up by: Julieanne Manson MD,  November 23, 2009 1:50 PM    Additional Follow-up for Phone Call Additional follow up Details #2::    Pt aware and appts made. Follow-up by: Vesta Mixer CMA,  November 26, 2009 11:44 AM

## 2010-09-09 NOTE — Letter (Signed)
Summary: FAXED REQUESTED RECORDS TO [facility]  FAXED REQUESTED RECORDS TO [facility]   Imported By: Arta Bruce 05/26/2010 14:59:02  _____________________________________________________________________  External Attachment:    Type:   Image     Comment:   External Document

## 2010-09-09 NOTE — Progress Notes (Signed)
Summary: Needs referral for MRI and orthopedist  Phone Note Call from Patient   Caller: Daughter Summary of Call: Dr. Delrae Alfred referred pt. to [facility] for MRI, [facility] card not eligible there, so they wouldn't do the procedure without a co-pay.  Needs to see a orthopedist but doesn't have downpayment for the MRI or referral.  Please call the father's number and he will contact his daughter -- she is very anxious for him to be treated by someone. Initial call taken by: Dutch Quint RN,  March 10, 2010 5:10 PM  Follow-up for Phone Call        I SPOKE TO PT HE SAID HE WENT TO [facility] TO SEE THE ORTHOPEDIC EVERYTHING WAS FINE BUT THEY REFER HIM TO HAVE A MRI SOMEWHERE ELSE HAT COST HIM $4000 .  HE WANTS TO KNOW WHAT HE CAN DO  BECAUSE HE CAN'T PAY THAT . Follow-up by: Cheryll Dessert,  March 14, 2010 8:53 AM  Additional Follow-up for Phone Call Additional follow up Details #1::        Would [facility] physician be willing to let us do the MRI here and send results and films?  Please call the rererral provider and see  Additional Follow-up by: Julieanne Manson MD,  March 18, 2010 8:50 AM    Additional Follow-up for Phone Call Additional follow up Details #2::    I call [facility] they said taht is ok that for the pt to have the MRI with [place] but Dr Verlon Setting need to have the film and the results and the Referral is for MRI LEFT KNEE WITHOUT CONTRACT . Follow-up by: Cheryll Dessert,  March 20, 2010 3:51 PM

## 2010-09-09 NOTE — Letter (Signed)
Summary: Generic Letter  [facility]  [address] [place], [place] [address]   Phone: [phone]  Fax: [phone]    03/02/2010  Re:  Samuel Cooke      [address]      [place], [place]  [address]  To Whom It May Concern:  [name] has mild spondylosis of his lumbosacral spine, multpile osteochondromas at the left knee, for which I hope to get him evaluated by orthopedic surgery, and OA of the right hip with hx of subchondral cyst formation.       Sincerely,   Julieanne Manson MD

## 2010-09-09 NOTE — Letter (Signed)
Summary: *Referral Letter  [facility]  [address] [place], [place] [address]   Phone: [phone]  Fax: [phone]    11/23/2009  Thank you in advance for agreeing to see my patient:  Samuel Cooke [address] [place], [place]  [address]  Phone: [phone]  Reason for Referral: 50 yo male with history of chronic left hip pain, history of surgery to right knee to remove bone spurs some time ago, who presented with bilateral knee, low back and bilateral groin pain beginning of April.  ESR of 7 mm, RF mildly elevated at 33, ANA elevated, but ENA panel negative.  Xray of lumbar spine unremarkable.  Left knee with reading of osteochondromas of medial distal femur, proximal tibia and fibula with suspicion of Hereditary Multiple Osteochondroma.  Pt's worst pain in left knee.  No obvious effusion, erythema or increased heat.  Ordering anticyclic citrullinated peptide Ab to confirm positive RF.   Procedures Requested: Evaluation for Hereditary Multiple Osteochondroma and whether lesions may be cause of knee pain.  Also, if he is felt to have HMO, recommendations to follow for possible malignant transformation.  Finally, treatment for joint discomfort if felt to be orthopedic rather than inflammatory rheumatologic etiology.  Current Medical Problems: 1)  OSTEOCHONDROMAS, MULTIPLE, LEFT LE (ICD-213.9) 2)  PAIN IN JOINT, LOW BACK, HIPS AND KNEES (ICD-719.49) 3)  ALCOHOL ABUSE (ICD-305.00) 4)  DEGENERATIVE JOINT DISEASE, LEFT HIP (ICD-715.95) 5)  LIVER FUNCTION TESTS, ABNORMAL, HX OF (ICD-V12.2) 6)  HYPERLIPIDEMIA, MIXED (ICD-272.2) 7)  ENCOUNTER FOR LONG-TERM USE OF OTHER MEDICATIONS (ICD-V58.69) 8)  HEALTH MAINTENANCE EXAM (ICD-V70.0) 9)  DEGENERATIVE JOINT DISEASE, RIGHT HIP (ICD-715.95) 10)  ANKLE SPRAIN, RIGHT (ICD-845.00) 11)  ESSENTIAL HYPERTENSION (ICD-401.9) 12)  DIABETES MELLITUS, TYPE II, UNCONTROLLED (ICD-250.02)   Current Medications: 1)   GLUCOMETER ELITE CLASSIC  KIT (BLOOD GLUCOSE MONITORING SUPPL) test blood sugar twice daily 2)  GLUCOMETER ELITE TEST  STRP (GLUCOSE BLOOD) two times a day blood sugar checks 3)  LISINOPRIL 10 MG TABS (LISINOPRIL) 1 tab by mouth daily 4)  TOPROL XL 50 MG XR24H-TAB (METOPROLOL SUCCINATE) 1 tab by mouth daily 5)  TRAMADOL HCL 50 MG TABS (TRAMADOL HCL) 1 tab by mouth two times a day as needed for pain- may refill only every 30 days 6)  GLUCOTROL XL 5 MG XR24H-TAB (GLIPIZIDE) 1 tab by mouth daily in morning 7)  METFORMIN HCL 1000 MG TABS (METFORMIN HCL) 1 tab by mouth two times a day 8)  LOVAZA 1 GM CAPS (OMEGA-3-ACID ETHYL ESTERS) 4 caps by mouth daily 9)  LIPITOR 20 MG TABS (ATORVASTATIN CALCIUM) 1 tab by mouth daily 10)  HYDROCODONE-ACETAMINOPHEN 5-500 MG TABS (HYDROCODONE-ACETAMINOPHEN) 1-2 tabs by mouth every 4-6 hours as needed for pain.   Past Medical History: 1)  DEGENERATIVE JOINT DISEASE, RIGHT HIP (ICD-715.95) 2)  ANKLE SPRAIN, RIGHT (ICD-845.00) 3)  HYPERCHOLESTEROLEMIA (ICD-272.0) 4)  ESSENTIAL HYPERTENSION (ICD-401.9) 5)  DIABETES MELLITUS, TYPE II, UNCONTROLLED (ICD-250.02)   Prior History of Blood Transfusions:   Pertinent Labs:    Thank you again for agreeing to see our patient; please contact us if you have any further questions or need additional information.  Sincerely,  Julieanne Manson MD

## 2010-09-09 NOTE — Progress Notes (Signed)
Summary: Letter Request  Phone Note Call from Patient Call back at [phone]   Summary of Call: Samuel. Samuel Cooke would like the provider write down a letter explaining his health condition which include everything related with his  knee and hip.  Please you can send the letter to his mailing address. Mahi Zabriskie MD Initial call taken by: Manon Hilding,  February 14, 2010 4:06 PM  Follow-up for Phone Call        Pt states the letter is for him and can be addressed to him. Follow-up by: Vesta Mixer CMA,  February 18, 2010 10:11 AM  Additional Follow-up for Phone Call Additional follow up Details #1::        Samuel Cooke  CALLED AND SAYS THAT HE NEEDS HIS LETTER A.S.A.P Additional Follow-up by: Leodis Rains,  February 20, 2010 2:33 PM  New Problems: DEGENERATIVE JOINT DISEASE, LEFT KNEE (ICD-715.96) OSTEOCHONDROMAS, MULTIPLE, LEFT LE (ICD-213.9) PAIN IN JOINT, LOW BACK, HIPS AND KNEES (ICD-719.49) DEGENERATIVE JOINT DISEASE, RIGHT HIP (ICD-715.95)   Additional Follow-up for Phone Call Additional follow up Details #2::    Done Follow-up by: Julieanne Manson MD,  March 02, 2010 5:18 PM  New Problems: DEGENERATIVE JOINT DISEASE, LEFT KNEE (ICD-715.96) OSTEOCHONDROMAS, MULTIPLE, LEFT LE (ICD-213.9) PAIN IN JOINT, LOW BACK, HIPS AND KNEES (ICD-719.49) DEGENERATIVE JOINT DISEASE, RIGHT HIP (ICD-715.95)

## 2010-09-09 NOTE — Letter (Signed)
Summary: [facility]  [facility]   Imported By: Arta Bruce 12/31/2009 15:59:36  _____________________________________________________________________  External Attachment:    Type:   Image     Comment:   External Document

## 2010-09-09 NOTE — Letter (Signed)
Summary: MAILED REQUESTED RECORDS TO [facility] [employer]  MAILED REQUESTED RECORDS TO [facility] [employer]   Imported By: Arta Bruce 05/09/2010 11:05:48  _____________________________________________________________________  External Attachment:    Type:   Image     Comment:   External Document

## 2010-09-09 NOTE — Letter (Signed)
Summary: TEST ORDER FORM/MRI//APPT DATE & TIME  TEST ORDER FORM/MRI//APPT DATE & TIME   Imported By: Arta Bruce 03/26/2010 10:33:13  _____________________________________________________________________  External Attachment:    Type:   Image     Comment:   External Document

## 2010-09-09 NOTE — Progress Notes (Signed)
Summary: Needs office visit  Phone Note Outgoing Call   Summary of Call: Samuel Cooke pt - recieved request for pt's cholesterol meds.  filled x 30 days no lipids since 09/2008 he needs and f/u for his diabetes which was uncontrolled back in 03/2009 at which time he can get labs to include lipids/lft  Initial call taken by: Lehman Prom FNP,  September 02, 2009 4:24 PM  Follow-up for Phone Call        Pt scheduled for Jan 28. Follow-up by: Vesta Mixer CMA,  September 02, 2009 4:30 PM

## 2010-09-09 NOTE — Letter (Signed)
Summary: PSYCHIATRIC EVAL  PSYCHIATRIC EVAL   Imported By: Arta Bruce 04/29/2010 10:18:17  _____________________________________________________________________  External Attachment:    Type:   Image     Comment:   External Document

## 2010-09-09 NOTE — Progress Notes (Signed)
  Phone Note Outgoing Call   Call placed by: Julieanne Manson MD,  November 23, 2009 1:52 PM Summary of Call: Debra--see order for ortho referral--suspect will need to go to [facility] ortho clinic.  Letter and xrays, labs in past month Initial call taken by: Julieanne Manson MD,  November 23, 2009 1:53 PM

## 2010-09-09 NOTE — Progress Notes (Signed)
Summary: ortho referral  Phone Note Outgoing Call   Summary of Call: Debra--any work on his ortho referral from 4/11? Initial call taken by: Julieanne Manson MD,  February 07, 2010 5:46 PM  Follow-up for Phone Call        Pt no showed for appt on 01-17-10 @ 9am with Dr.Sykes...  I r/s appt 03-06-10 @ 8:30am..Marland KitchenPt aware of appt Follow-up by: Candi Leash,  February 11, 2010 2:52 PM

## 2010-09-09 NOTE — Letter (Signed)
Summary: Lipid Letter  [facility]  [address] [place], [place] [address]   Phone: [phone]  Fax: [phone]    10/12/2009  Samuel Cooke, [place]  [address]  Dear Samuel Cooke:  We have carefully reviewed your last lipid profile from 09/20/2009 and the results are noted below with a summary of recommendations for lipid management.    Cholesterol:       189     Goal: <200   HDL "good" Cholesterol:   47     Goal: >45   LDL "bad" Cholesterol:   118     Goal: <70   Triglycerides:       120     Goal: <150    Your cholesterol is much better than last check a year ago.  However, I'm not sure why the lab was done as I wanted you on the Lovaza along with the Pravastatin for 3 months --the lab was supposed to be drawn just before your next visit with me in 3 months--so keep that appt. and we'll see what your cholesterol is doing then as well.    TLC Diet (Therapeutic Lifestyle Change): Saturated Fats & Transfatty acids should be kept < 7% of total calories ***Reduce Saturated Fats Polyunstaurated Fat can be up to 10% of total calories Monounsaturated Fat Fat can be up to 20% of total calories Total Fat should be no greater than 25-35% of total calories Carbohydrates should be 50-60% of total calories Protein should be approximately 15% of total calories Fiber should be at least 20-30 grams a day ***Increased fiber may help lower LDL Total Cholesterol should be < 200mg /day Consider adding plant stanol/sterols to diet (example: Benacol spread) ***A higher intake of unsaturated fat may reduce Triglycerides and Increase HDL    Adjunctive Measures (may lower LIPIDS and reduce risk of Heart Attack) include: Aerobic Exercise (20-30 minutes 3-4 times a week) Limit Alcohol Consumption Weight Reduction Aspirin 75-81 mg a day by mouth (if not allergic or contraindicated) Dietary Fiber 20-30 grams a day by mouth     Current Medications: 1)     Glucometer Elite Classic  Kit (Blood glucose monitoring suppl) .... Test blood sugar twice daily 2)    Glucometer Elite Test  Strp (Glucose blood) .... Two times a day blood sugar checks 3)    Lisinopril 10 Mg Tabs (Lisinopril) .Marland Kitchen.. 1 tab by mouth daily 4)    Toprol Xl 50 Mg Xr24h-tab (Metoprolol succinate) .Marland Kitchen.. 1 tab by mouth daily 5)    Tramadol Hcl 50 Mg Tabs (Tramadol hcl) .Marland Kitchen.. 1 tab by mouth two times a day as needed for pain- may refill only every 30 days 6)    Glucotrol Xl 5 Mg Xr24h-tab (Glipizide) .Marland Kitchen.. 1 tab by mouth daily in morning 7)    Metformin Hcl 1000 Mg Tabs (Metformin hcl) .Marland Kitchen.. 1 tab by mouth two times a day 8)    Lovaza 1 Gm Caps (Omega-3-acid ethyl esters) .... 4 caps by mouth daily 9)    Pravastatin Sodium 40 Mg Tabs (Pravastatin sodium) .... Two tablets by mouth nightly for cholesterol **needs lab visit**  If you have any questions, please call. We appreciate being able to work with you.   Sincerely,    [facility] Samuel Manson MD

## 2010-09-09 NOTE — Letter (Signed)
Summary: REFERRAL/ORTHOPEDIC SURGEON//APPT DATE & TIME  REFERRAL/ORTHOPEDIC SURGEON//APPT DATE & TIME   Imported By: Arta Bruce 02/13/2010 15:28:54  _____________________________________________________________________  External Attachment:    Type:   Image     Comment:   External Document

## 2010-09-09 NOTE — Assessment & Plan Note (Signed)
Summary: Knee injection /tmm   Vital Signs:  Patient profile:   50 year old male Weight:      169 pounds Temp:     98.5 degrees F oral Pulse rate:   108 / minute Pulse rhythm:   regular Resp:     18 per minute BP sitting:   118 / 82  Vitals Entered By: Linzie Collin CC: knee injection, knee,lower back and spine pain Is Patient Diabetic? Yes Pain Assessment Patient in pain? yes     Location: knee Intensity: 10 Type: aching Onset of pain  Constant CBG Result 209  Does patient need assistance? Functional Status Self care Ambulation Normal   CC:  knee injection, knee, and lower back and spine pain.  Current Medications (verified): 1)  Glucometer Elite Classic  Kit (Blood Glucose Monitoring Suppl) .... Test Blood Sugar Twice Daily 2)  Glucometer Elite Test  Strp (Glucose Blood) .... Two Times A Day Blood Sugar Checks 3)  Lisinopril 10 Mg Tabs (Lisinopril) .Marland Kitchen.. 1 Tab By Mouth Daily 4)  Toprol Xl 50 Mg Xr24h-Tab (Metoprolol Succinate) .Marland Kitchen.. 1 Tab By Mouth Daily 5)  Tramadol Hcl 50 Mg Tabs (Tramadol Hcl) .Marland Kitchen.. 1 Tab By Mouth Two Times A Day As Needed For Pain- May Refill Only Every 30 Days 6)  Glucotrol Xl 5 Mg Xr24h-Tab (Glipizide) .Marland Kitchen.. 1 Tab By Mouth Daily in Morning 7)  Metformin Hcl 1000 Mg Tabs (Metformin Hcl) .Marland Kitchen.. 1 Tab By Mouth Two Times A Day 8)  Lovaza 1 Gm Caps (Omega-3-Acid Ethyl Esters) .... 4 Caps By Mouth Daily 9)  Lipitor 20 Mg Tabs (Atorvastatin Calcium) .Marland Kitchen.. 1 Tab By Mouth Daily 10)  Hydrocodone-Acetaminophen 5-500 Mg Tabs (Hydrocodone-Acetaminophen) .Marland Kitchen.. 1-2 Tabs By Mouth Every 4-6 Hours As Needed For Pain.  Allergies (verified): No Known Drug Allergies  Physical Exam  Extremities:  Discussed risk and benefits with pt. who agreed to procedure.  Left knee cleaned in sterile fashion, 1% lidocaine injected Subcutaneously and to knee capsule with 3cc volume.  40 mg of Depomedrol with 1/2 cc lidocaine injected into knee joint.  Pt. tolerated well without  complication.  Good hemostasis.   Complete Medication List: 1)  Glucometer Elite Classic Kit (Blood glucose monitoring suppl) .... Test blood sugar twice daily 2)  Glucometer Elite Test Strp (Glucose blood) .... Two times a day blood sugar checks 3)  Lisinopril 10 Mg Tabs (Lisinopril) .Marland Kitchen.. 1 tab by mouth daily 4)  Toprol Xl 50 Mg Xr24h-tab (Metoprolol succinate) .Marland Kitchen.. 1 tab by mouth daily 5)  Tramadol Hcl 50 Mg Tabs (Tramadol hcl) .Marland Kitchen.. 1 tab by mouth two times a day as needed for pain- may refill only every 30 days 6)  Glucotrol Xl 5 Mg Xr24h-tab (Glipizide) .Marland Kitchen.. 1 tab by mouth daily in morning 7)  Metformin Hcl 1000 Mg Tabs (Metformin hcl) .Marland Kitchen.. 1 tab by mouth two times a day 8)  Lovaza 1 Gm Caps (Omega-3-acid ethyl esters) .... 4 caps by mouth daily 9)  Lipitor 20 Mg Tabs (Atorvastatin calcium) .Marland Kitchen.. 1 tab by mouth daily 10)  Hydrocodone-acetaminophen 5-500 Mg Tabs (Hydrocodone-acetaminophen) .Marland Kitchen.. 1-2 tabs by mouth every 4-6 hours as needed for pain.  Other Orders: Joint Aspirate / Injection, Large (20610)  Patient Instructions: 1)  Follow up with Dr. Delrae Alfred in 4 months --DM and knee pain 2)  Call if redness, swelling, discharge, increased pain, fever from knee   Vital Signs:  Patient profile:   50 year old male Weight:      169  pounds Temp:     98.5 degrees F oral Pulse rate:   108 / minute Pulse rhythm:   regular Resp:     18 per minute BP sitting:   118 / 82  Vitals Entered By: Linzie Collin   Laboratory Results   Blood Tests     HGBA1C: 7.3%   (Normal Range: Non-Diabetic - 3-6%   Control Diabetic - 6-8%) CBG Random:: 209mg /dL

## 2010-09-09 NOTE — Letter (Signed)
Summary: Samuel Cooke [facility]  [facility]   Imported By: Arta Bruce 07/09/2010 16:15:40  _____________________________________________________________________  External Attachment:    Type:   Image     Comment:   External Document

## 2010-10-15 ENCOUNTER — Telehealth (INDEPENDENT_AMBULATORY_CARE_PROVIDER_SITE_OTHER): Payer: Self-pay | Admitting: Internal Medicine

## 2010-10-27 LAB — POCT I-STAT, CHEM 8
Calcium, Ion: 0.96 mmol/L — ABNORMAL LOW (ref 1.12–1.32)
Chloride: 106 mEq/L (ref 96–112)
Creatinine, Ser: 0.8 mg/dL (ref 0.4–1.5)
Glucose, Bld: 234 mg/dL — ABNORMAL HIGH (ref 70–99)
HCT: 48 % (ref 39.0–52.0)
TCO2: 23 mmol/L (ref 0–100)

## 2010-11-11 NOTE — Progress Notes (Signed)
Phone Note From Pharmacy   Summary of Call: Samuel Cooke--is Samuel Cooke still our patient?  I seem to remember a lot of no shows.  He has not been seen since 02/2010 and rarely fills his meds, but is asking for refills.   Initial call taken by: Julieanne Manson MD,  October 15, 2010 4:29 PM  Follow-up for Phone Call        Samuel Cooke--he is continuing to ask for refills--is he still our pt? Follow-up by: Julieanne Manson MD,  October 28, 2010 6:29 PM  Additional Follow-up for Phone Call Additional follow up Details #1::        Note on med list that he is still our patient. Needs ov in next 2 months to continue receiving refills. Please notify Additional Follow-up by: Julieanne Manson MD,  October 30, 2010 9:55 AM    Additional Follow-up for Phone Call Additional follow up Details #2::    LM on [phone]... Samuel Cooke CMA  October 31, 2010 3:20 PM   spoke with pt and appt is schedule.Marland KitchenMarland KitchenArmenia Cooke  November 06, 2010 9:35 AM   Prescriptions: LOVAZA 1 GM CAPS (OMEGA-3-ACID ETHYL ESTERS) 4 caps by mouth daily  #120 x 2   Entered and Authorized by:   Julieanne Manson MD   Signed by:   Julieanne Manson MD on 10/30/2010   Method used:   Faxed to ...       [facility] (retail)       [address] [place], [place]  [address]       Ph: [phone]       Fax: [phone]   RxID:   [id number] METFORMIN HCL 1000 MG TABS (METFORMIN HCL) 1 tab by mouth two times a day  #60 x 2   Entered and Authorized by:   Julieanne Manson MD   Signed by:   Julieanne Manson MD on 10/30/2010   Method used:   Faxed to ...       [facility] (retail)       [address] [place], [place]  [address]       Ph: [phone]       Fax: [phone]   RxID:   [id number] GLUCOTROL XL 5 MG XR24H-TAB (GLIPIZIDE) 1 tab by mouth daily in morning  #30 x 2   Entered and Authorized by:   Julieanne Manson MD  Signed by:   Julieanne Manson MD on 10/30/2010   Method used:   Faxed to ...       [facility] (retail)       [address] [place], [place]  [address]       Ph: [phone]       Fax: [phone]   RxID:   [id number] TOPROL XL 50 MG XR24H-TAB (METOPROLOL SUCCINATE) 1 tab by mouth daily  #30 x 2   Entered and Authorized by:   Julieanne Manson MD   Signed by:   Julieanne Manson MD on 10/30/2010   Method used:   Faxed to ...       [facility] (retail)       [address] [place], [place]  [address]       Ph: [phone]       Fax: [phone]   RxID:  [id number] LISINOPRIL 10 MG TABS (LISINOPRIL) 1 tab by mouth daily  #30 x 2   Entered and Authorized by:   Julieanne Manson MD   Signed by:   Julieanne Manson MD on 10/30/2010   Method used:   Faxed to ...       [facility] (retail)       [address] [place], [place]  [address]       Ph: [phone]       Fax: [phone]   RxID:   [id number]

## 2010-12-13 ENCOUNTER — Inpatient Hospital Stay (HOSPITAL_COMMUNITY)
Admission: EM | Admit: 2010-12-13 | Discharge: 2010-12-15 | DRG: 069 | Disposition: A | Discharge status: Home / Self Care | Payer: Self-pay | Attending: Internal Medicine | Admitting: Internal Medicine

## 2010-12-13 ENCOUNTER — Emergency Department (HOSPITAL_COMMUNITY): Payer: Self-pay

## 2010-12-13 DIAGNOSIS — R5381 Other malaise: Secondary | ICD-10-CM | POA: Diagnosis present

## 2010-12-13 DIAGNOSIS — I10 Essential (primary) hypertension: Secondary | ICD-10-CM | POA: Diagnosis present

## 2010-12-13 DIAGNOSIS — E119 Type 2 diabetes mellitus without complications: Secondary | ICD-10-CM | POA: Diagnosis present

## 2010-12-13 DIAGNOSIS — M5137 Other intervertebral disc degeneration, lumbosacral region: Secondary | ICD-10-CM | POA: Diagnosis present

## 2010-12-13 DIAGNOSIS — F17200 Nicotine dependence, unspecified, uncomplicated: Secondary | ICD-10-CM | POA: Diagnosis present

## 2010-12-13 DIAGNOSIS — E785 Hyperlipidemia, unspecified: Secondary | ICD-10-CM | POA: Diagnosis present

## 2010-12-13 DIAGNOSIS — M51379 Other intervertebral disc degeneration, lumbosacral region without mention of lumbar back pain or lower extremity pain: Secondary | ICD-10-CM | POA: Diagnosis present

## 2010-12-13 DIAGNOSIS — M5380 Other specified dorsopathies, site unspecified: Secondary | ICD-10-CM | POA: Diagnosis present

## 2010-12-13 DIAGNOSIS — R5383 Other fatigue: Secondary | ICD-10-CM | POA: Diagnosis present

## 2010-12-13 DIAGNOSIS — G459 Transient cerebral ischemic attack, unspecified: Principal | ICD-10-CM | POA: Diagnosis present

## 2010-12-13 DIAGNOSIS — L989 Disorder of the skin and subcutaneous tissue, unspecified: Secondary | ICD-10-CM | POA: Diagnosis present

## 2010-12-13 DIAGNOSIS — F1010 Alcohol abuse, uncomplicated: Secondary | ICD-10-CM | POA: Diagnosis present

## 2010-12-13 LAB — URINALYSIS, ROUTINE W REFLEX MICROSCOPIC
Bilirubin Urine: NEGATIVE
Glucose, UA: 250 mg/dL — AB
Hgb urine dipstick: NEGATIVE
Ketones, ur: 15 mg/dL — AB
Protein, ur: NEGATIVE mg/dL

## 2010-12-13 LAB — RAPID URINE DRUG SCREEN, HOSP PERFORMED
Benzodiazepines: NOT DETECTED
Cocaine: NOT DETECTED
Tetrahydrocannabinol: NOT DETECTED

## 2010-12-13 LAB — GLUCOSE, CAPILLARY: Glucose-Capillary: 305 mg/dL — ABNORMAL HIGH (ref 70–99)

## 2010-12-13 LAB — COMPREHENSIVE METABOLIC PANEL
AST: 88 U/L — ABNORMAL HIGH (ref 0–37)
Calcium: 8.7 mg/dL (ref 8.4–10.5)
Chloride: 95 mEq/L — ABNORMAL LOW (ref 96–112)
GFR calc Af Amer: >60 mL/min (ref >60)
GFR calc non Af Amer: >60 mL/min (ref >60)
Total Bilirubin: 0.8 mg/dL (ref 0.3–1.2)
Total Protein: 7.3 g/dL (ref 6.0–8.3)

## 2010-12-13 LAB — CBC
HCT: 40.8 % (ref 39.0–52.0)
Hemoglobin: 14.1 g/dL (ref 13.0–17.0)
MCH: 33 pg (ref 26.0–34.0)
MCHC: 34.5 g/dL (ref 30.0–36.0)
MCV: 95.6 fL (ref 78.0–100.0)
Platelets: 165 10*3/uL (ref 150–400)
RDW: 14.3 % (ref 11.5–15.5)
RDW: 14.4 % (ref 11.5–15.5)

## 2010-12-13 LAB — HEMOGLOBIN A1C: Hgb A1c MFr Bld: 6.5 % — ABNORMAL HIGH (ref <5.7)

## 2010-12-13 LAB — POCT CARDIAC MARKERS
CKMB, poc: 3.8 ng/mL (ref 1.0–8.0)
Myoglobin, poc: 386 ng/mL (ref 12–200)
Troponin i, poc: <0.05 ng/mL (ref 0.00–0.09)

## 2010-12-13 LAB — BASIC METABOLIC PANEL
BUN: 17 mg/dL (ref 6–23)
CO2: 25 mEq/L (ref 19–32)
GFR calc Af Amer: >60 mL/min (ref >60)
Glucose, Bld: 124 mg/dL — ABNORMAL HIGH (ref 70–99)
Potassium: 3.9 mEq/L (ref 3.5–5.1)

## 2010-12-13 LAB — DIFFERENTIAL
Eosinophils Absolute: 0 10*3/uL (ref 0.0–0.7)
Eosinophils Relative: 1 % (ref 0–5)
Lymphs Abs: 1.5 10*3/uL (ref 0.7–4.0)
Monocytes Absolute: 0.5 10*3/uL (ref 0.1–1.0)
Monocytes Relative: 8 % (ref 3–12)
Neutrophils Relative %: 65 % (ref 43–77)

## 2010-12-13 LAB — ETHANOL: Alcohol, Ethyl (B): <11 mg/dL — ABNORMAL HIGH (ref 0–10)

## 2010-12-13 LAB — CK TOTAL AND CKMB (NOT AT ARMC)
Relative Index: 0.6 (ref 0.0–2.5)
Total CK: 1073 U/L — ABNORMAL HIGH (ref 7–232)

## 2010-12-13 LAB — PROTIME-INR: Prothrombin Time: 13 seconds (ref 11.6–15.2)

## 2010-12-14 ENCOUNTER — Inpatient Hospital Stay (HOSPITAL_COMMUNITY): Payer: Self-pay

## 2010-12-14 DIAGNOSIS — I6789 Other cerebrovascular disease: Secondary | ICD-10-CM

## 2010-12-14 LAB — LIPID PANEL
HDL: 68 mg/dL (ref >39)
Triglycerides: 143 mg/dL (ref <150)

## 2010-12-14 LAB — GLUCOSE, CAPILLARY
Glucose-Capillary: 161 mg/dL — ABNORMAL HIGH (ref 70–99)
Glucose-Capillary: 152 mg/dL — ABNORMAL HIGH (ref 70–99)

## 2010-12-15 DIAGNOSIS — G459 Transient cerebral ischemic attack, unspecified: Secondary | ICD-10-CM

## 2010-12-15 LAB — GLUCOSE, CAPILLARY: Glucose-Capillary: 174 mg/dL — ABNORMAL HIGH (ref 70–99)

## 2010-12-15 LAB — BASIC METABOLIC PANEL
CO2: 29 mEq/L (ref 19–32)
Calcium: 9 mg/dL (ref 8.4–10.5)
GFR calc Af Amer: >60 mL/min (ref >60)
GFR calc non Af Amer: >60 mL/min (ref >60)
Sodium: 139 mEq/L (ref 135–145)

## 2010-12-15 LAB — CBC
Hemoglobin: 12.5 g/dL — ABNORMAL LOW (ref 13.0–17.0)
MCHC: 33.2 g/dL (ref 30.0–36.0)
Platelets: 151 10*3/uL (ref 150–400)
RDW: 14 % (ref 11.5–15.5)

## 2010-12-30 NOTE — H&P (Signed)
Samuel Cooke, Samuel Cooke             ACCOUNT NO.:  [id number]  MEDICAL RECORD NO.:  [id number]           PATIENT TYPE:  E  LOCATION:  [facility]                         FACILITY:  [facility]  PHYSICIAN:  Clydia Llano, MD       DATE OF BIRTH:  1960/09/01  DATE OF ADMISSION:  12/13/2010 DATE OF DISCHARGE:                             HISTORY & PHYSICAL   PRIMARY CARE PHYSICIAN:  [facility]  REASON FOR ADMISSION:  Right-sided weakness.  HISTORY OF PRESENT ILLNESS:  Samuel Cooke is a 50 year old diabetic hypertensive male with dyslipidemia.  The patient came into the hospital for right-sided weakness.  The patient was in his usual state of health until he went to the bed last night after he ate in a [employer].  He woke up at about 3 in the morning, he felt some right-sided weakness, he ignored it.  He went to sleep again.  He woke up at 9 o'clock in the morning with weakness involving his right upper and lower extremities. Family did not notice any change in his speech type.  The patient felt he is getting better a little bit, but he came into the hospital to be evaluated.  Upon initial evaluation in the hospital, CT scan of the head showed no acute findings.  His neurological examination is almost back to normal except with very minimal right lower extremity weakness.  The ED physician noticed also some shaking and the patient will be admitted for further evaluation.  PAST MEDICAL HISTORY: 1. Diabetes mellitus type 2. 2. Hypertension. 3. Dyslipidemia. 4. Multiple skin lesions, being followed as outpatient.  FAMILY HISTORY:  Mother alive and well, had heart attack when she was 69, she is 48 now.  Father died at the age of 75 secondary to colon cancer.  SOCIAL HISTORY:  The patient lives in [place] with wife.  The patient smokes half pack per day for almost 30 years.  The patient drinks two shots of whiskey every night for the past 5 years.  The patient never had DTs or withdrawal  according to him.  He had sometimes went over 4-5 days without drinking.  ALLERGIES:  NKDA.  MEDICATIONS: 1. Lisinopril 10 mg p.o. daily. 2. Toprol-XL 50 mg p.o. daily. 3. Tramadol 50 mg b.i.d. as needed for pain. 4. Glucotrol XL 5 mg p.o. daily. 5. Metformin 1000 mg p.o. b.i.d. 6. Lovaza 1 g 4 capsules p.o. daily. 7. Lipitor 20 mg p.o. daily. 8. Hydrocodone and acetaminophen 1-2 tabs every 4-6 hours as needed     for pain.  REVIEW OF SYSTEMS:  GENERAL:  Denies fever, chills, or sweats. HEENT:  Denies headache or nasal discharge. SKIN:  Has some rash or circular lesions in the right lower extremity. CARDIAC:  Denies chest pain or shortness of breath. PULMONARY:  Denies cough or wheezes. GENITOURINARY:  Denies frequency or dysuria. NEUROLOGIC:  Denies weakness or numbness. MUSCULOSKELETAL:  Denies arthralgia, joint swelling, or deformity. GASTROINTESTINAL:  Denies nausea, vomiting, or diarrhea. ENDOCRINE:  Denies polyuria or polydipsia.  PHYSICAL EXAMINATION:  VITAL SIGNS:  Temperature is 98.8, respirations 16, pulse is 94, blood pressure is 134/82, and O2 sats  is 48. GENERAL:  The patient is a well-developed Hispanic male wearing hospital gown in no acute distress. HEAD:  Normocephalic, atraumatic. EYES:  Pupils are equal and reactive to light and accommodation. MOUTH:  Without oral thrush or lesions. ENT:  No abnormalities. NECK:  No JVD appreciated.  No meningeal signs. LUNGS:  Clear to auscultation bilaterally. CHEST:  Nontender. CARDIOVASCULAR:  Regular rate and rhythm.  No murmurs, rubs, or gallops. ABDOMEN:  Bowel sounds heard.  Soft, nontender or distended. LOWER EXTREMITIES:  No pedal edema.  There is circular rash in the right lower extremity in the anterior aspect of the dorsum of the right foot. NEUROLOGIC:  Alert, awake, and oriented x3.  Cranial nerves II through XII grossly intact.  The patient has minimal shaking and minimal tremors.  Sensation is normal.   Motor on left side normal.  Right upper extremity normal.  Right lower extremity weaker than the left, side but at least 4/5.  RADIOLOGY DATA:  CT head showed negative exam.  LABORATORY DATA: 1. BMP; sodium 136, potassium 3.9, chloride 97, bicarb is 25, glucose     124, BUN is 17, and creatinine 0.8. 2. Cardiac enzymes; myoglobin is 386, CK-MB 3.8, and troponin less     than 0.05. 3. CBC; WBC is 5.9, hemoglobin 13.7, hematocrit 39.7, and platelets     count 165.  ASSESSMENT/PLAN: 1. Right-sided weakness.  Likely transient ischemic attack.  Right-     sided weakness is getting better.  The patient still had minimal     right-sided weakness in the lower extremity.  The patient will be     evaluated by EKG, echocardiogram, carotid Doppler, MRI of the     brain, as well as MRA.  Urine drug screen and alcohol level will be     obtained. 2. Dyslipidemia.  Fasting lipid profile will be obtained as well.  His     medication will be continued. 3. Diabetes mellitus type 2.  We will restart his medications as well     as insulin sliding scale and carbohydrate modified diet. 4. Medication.  Hypertension medications will be restarted as well. 5. Alcohol abuse.  The patient drinks two shots of whiskey a day.  The     ED physician, Dr. Bebe Shaggy told me that     the patient drinks a lot and the patient might be going through     withdrawal right now.  The patient drinks two shots of whiskey     every night and the last time he drank was last night.  We will     monitor closely for withdrawals as a typical time is after 2 days.     Clydia Llano, MD     ME/MEDQ  D:  12/13/2010  T:  12/13/2010  Job:  [id number]  cc:   Melvern Banker  Electronically Signed by Clydia Llano  on 12/30/2010 03:29:39 PM

## 2011-01-03 NOTE — Discharge Summary (Signed)
NAMEBUEL, MOLDER             ACCOUNT NO.:  [id number]  MEDICAL RECORD NO.:  [id number]           PATIENT TYPE:  I  LOCATION:  3022                         FACILITY:  [facility]  PHYSICIAN:  Lonia Blood, M.D.       DATE OF BIRTH:  [birth year]  DATE OF ADMISSION:  12/13/2010 DATE OF DISCHARGE:  12/15/2010                              DISCHARGE SUMMARY   PRIMARY CARE PHYSICIAN:  [facility], Marcene Duos, MD  DISCHARGE DIAGNOSES: 1. Transient ischemic attack. 2. Hyperlipidemia. 3. Diabetes mellitus type 2. 4. Alcohol abuse. 5. Tobacco abuse. 6. Chronic degenerative disk disease of the lumbar sacral spine. 7. Asymmetric uncinate spurring of the C3-C4 on the right.  DISCHARGE MEDICATIONS: 1. Aspirin 325 mg daily. 2. Clotrimazole/betamethasone 1% cream topically 3 times a day. 3. Folic acid 1 mg daily. 4. Glucotrol 10 mg daily. 5. Hydrocodone/APAP 5/325 one every 6 hours as needed for pain. 6. Lipitor 20 mg daily. 7. Lisinopril 10 mg daily. 8. Lovaza 1 g 4 capsules daily. 9. Metformin 2000 mg daily. 10.Multivitamin 1 capsule daily. 11.Vitamin B1 100 grams daily. 12.Toprol-XL 50 mg daily. 13.Vitamin C 1 tablet daily.  CONDITION ON DISCHARGE:  Mr. Ratz was discharged in good condition, neurologically intact, told not to smoke, not to drink, and with a followup appointment with [facility] as well as [facility].  PROCEDURE THIS ADMISSION: 1. On Dec 13, 2010, the patient underwent an MRI of the brain which was     negative for acute strokes. 2. On Dec 13, 2010, MRA of the brain which was negative for any     significant intracranial stenosis. 3. Echocardiogram, Dec 14, 2010, showing ejection fraction of 50-55%,     PA pressure 33 mmHg, mildly dilated left atrium. 4. Carotid Dopplers on Dec 15, 2010, which was negative for significant     extracranial internal carotid  artery stenosis. 5. MRI of the cervical spine which showed asymmetric uncinate spurring     at C3-C4 of the right, this could be symptomatic.  CONSULTATIONS THIS ADMISSION:  No consultations obtained.  HISTORY AND PHYSICAL:  Refer to dictation H and P which was done by Dr. Arthor Captain.  HOSPITAL COURSE:  Mr. Colegrove is a 50 year old gentleman with history of tobacco abuse, alcohol abuse, presented to the emergency room with complaints of difficulty ambulating, right-sided weakness, and some speech difficulties.  By the time he arrived in the emergency room, his neurological deficits resolved completely.  He was admitted with a TIA and for workup.  Given the fact that the patient has diabetes, hypertension, and tobacco abuse, it was felt that his neurological symptoms were due to small-vessel disease.  He was educated about risk factor modification including alcohol and tobacco cessation and was started on daily aspirin.  Since on hospital day #2, he was still complaining of some right upper extremity numbness and weakness.  He underwent an MRI of the cervical spine which indicated he has a prominent uncinate process at C3-C4.  As the patient was already scheduled to follow with the orthopedic surgeon at  [facility], we provided him with the MRI report and as his neurological symptoms resolved completely by the time of discharge, we felt that he was fine to follow up as an outpatient for this new finding.  Otherwise, the patient was educated about his risk factor modification and discharged successfully on Dec 15, 2010.     Lonia Blood, M.D.     SL/MEDQ  D:  12/16/2010  T:  12/17/2010  Job:  [id number]  cc:   Marcene Duos, M.D.  Electronically Signed by Lonia Blood M.D. on 01/03/2011 08:13:15 AM

## 2011-09-23 DIAGNOSIS — M25559 Pain in unspecified hip: Secondary | ICD-10-CM | POA: Diagnosis not present

## 2011-10-01 DIAGNOSIS — M25559 Pain in unspecified hip: Secondary | ICD-10-CM | POA: Diagnosis not present

## 2011-10-01 DIAGNOSIS — M169 Osteoarthritis of hip, unspecified: Secondary | ICD-10-CM | POA: Diagnosis not present

## 2011-10-28 DIAGNOSIS — E119 Type 2 diabetes mellitus without complications: Secondary | ICD-10-CM | POA: Diagnosis not present

## 2011-10-28 DIAGNOSIS — L989 Disorder of the skin and subcutaneous tissue, unspecified: Secondary | ICD-10-CM | POA: Diagnosis not present

## 2011-12-15 DIAGNOSIS — L989 Disorder of the skin and subcutaneous tissue, unspecified: Secondary | ICD-10-CM | POA: Diagnosis not present

## 2011-12-15 DIAGNOSIS — D239 Other benign neoplasm of skin, unspecified: Secondary | ICD-10-CM | POA: Diagnosis not present

## 2011-12-15 DIAGNOSIS — B079 Viral wart, unspecified: Secondary | ICD-10-CM | POA: Diagnosis not present

## 2011-12-15 DIAGNOSIS — L259 Unspecified contact dermatitis, unspecified cause: Secondary | ICD-10-CM | POA: Diagnosis not present

## 2011-12-15 DIAGNOSIS — E119 Type 2 diabetes mellitus without complications: Secondary | ICD-10-CM | POA: Diagnosis not present

## 2011-12-23 DIAGNOSIS — M25559 Pain in unspecified hip: Secondary | ICD-10-CM | POA: Diagnosis not present

## 2012-01-20 DIAGNOSIS — E119 Type 2 diabetes mellitus without complications: Secondary | ICD-10-CM | POA: Diagnosis not present

## 2012-02-27 ENCOUNTER — Encounter (HOSPITAL_COMMUNITY): Payer: Self-pay | Admitting: *Deleted

## 2012-02-27 ENCOUNTER — Emergency Department (HOSPITAL_COMMUNITY)
Admission: EM | Admit: 2012-02-27 | Discharge: 2012-02-29 | Disposition: A | Discharge status: Home / Self Care | Payer: Medicare Other | Attending: Emergency Medicine | Admitting: Emergency Medicine

## 2012-02-27 DIAGNOSIS — R61 Generalized hyperhidrosis: Secondary | ICD-10-CM | POA: Diagnosis not present

## 2012-02-27 DIAGNOSIS — J40 Bronchitis, not specified as acute or chronic: Secondary | ICD-10-CM

## 2012-02-27 DIAGNOSIS — R739 Hyperglycemia, unspecified: Secondary | ICD-10-CM

## 2012-02-27 DIAGNOSIS — R05 Cough: Secondary | ICD-10-CM | POA: Insufficient documentation

## 2012-02-27 DIAGNOSIS — E7800 Pure hypercholesterolemia, unspecified: Secondary | ICD-10-CM | POA: Insufficient documentation

## 2012-02-27 DIAGNOSIS — I10 Essential (primary) hypertension: Secondary | ICD-10-CM | POA: Insufficient documentation

## 2012-02-27 DIAGNOSIS — R112 Nausea with vomiting, unspecified: Secondary | ICD-10-CM | POA: Insufficient documentation

## 2012-02-27 DIAGNOSIS — R079 Chest pain, unspecified: Secondary | ICD-10-CM | POA: Insufficient documentation

## 2012-02-27 DIAGNOSIS — F17200 Nicotine dependence, unspecified, uncomplicated: Secondary | ICD-10-CM | POA: Insufficient documentation

## 2012-02-27 DIAGNOSIS — F1010 Alcohol abuse, uncomplicated: Secondary | ICD-10-CM | POA: Insufficient documentation

## 2012-02-27 DIAGNOSIS — I6789 Other cerebrovascular disease: Secondary | ICD-10-CM | POA: Diagnosis not present

## 2012-02-27 DIAGNOSIS — R059 Cough, unspecified: Secondary | ICD-10-CM | POA: Insufficient documentation

## 2012-02-27 DIAGNOSIS — R000 Tachycardia, unspecified: Secondary | ICD-10-CM | POA: Insufficient documentation

## 2012-02-27 DIAGNOSIS — F1020 Alcohol dependence, uncomplicated: Secondary | ICD-10-CM | POA: Insufficient documentation

## 2012-02-27 DIAGNOSIS — Z79899 Other long term (current) drug therapy: Secondary | ICD-10-CM | POA: Insufficient documentation

## 2012-02-27 DIAGNOSIS — E119 Type 2 diabetes mellitus without complications: Secondary | ICD-10-CM | POA: Insufficient documentation

## 2012-02-27 HISTORY — DX: Essential (primary) hypertension: I10

## 2012-02-27 MED ORDER — ONDANSETRON HCL 4 MG/2ML IJ SOLN
INTRAMUSCULAR | Status: AC
  Filled 2012-02-27: qty 2

## 2012-02-27 NOTE — ED Notes (Signed)
Per ems- pt reports N/V/D today. Pt began having sharp chest pain that increased with movement and inspiration approx 1.5 hrs ago. intermittent pain and states that if felt like his acid reflux. . Pt is chest pain free at this time. HR 120. BP 190/108. CBG 231. Pt diaphoretic. 20 LAC. Pt received 4mg  of zofran en route. No nitro or Asprin given. Family reported to EMS that he had been "drinking" EMS unsure if he drank ETOH today.

## 2012-02-28 ENCOUNTER — Emergency Department (HOSPITAL_COMMUNITY): Payer: Medicare Other

## 2012-02-28 DIAGNOSIS — R109 Unspecified abdominal pain: Secondary | ICD-10-CM | POA: Diagnosis not present

## 2012-02-28 DIAGNOSIS — R1110 Vomiting, unspecified: Secondary | ICD-10-CM | POA: Diagnosis not present

## 2012-02-28 LAB — RAPID URINE DRUG SCREEN, HOSP PERFORMED
Benzodiazepines: NOT DETECTED
Cocaine: NOT DETECTED

## 2012-02-28 LAB — POCT I-STAT TROPONIN I: Troponin i, poc: 0.01 ng/mL (ref 0.00–0.08)

## 2012-02-28 LAB — COMPREHENSIVE METABOLIC PANEL
Albumin: 4.5 g/dL (ref 3.5–5.2)
BUN: 9 mg/dL (ref 6–23)
Calcium: 9.8 mg/dL (ref 8.4–10.5)
Creatinine, Ser: 0.7 mg/dL (ref 0.50–1.35)
GFR calc Af Amer: >90 mL/min (ref >90)
Glucose, Bld: 209 mg/dL — ABNORMAL HIGH (ref 70–99)
Total Protein: 8.2 g/dL (ref 6.0–8.3)

## 2012-02-28 LAB — CBC WITH DIFFERENTIAL/PLATELET
Basophils Relative: 0 % (ref 0–1)
Eosinophils Absolute: 0 10*3/uL (ref 0.0–0.7)
Eosinophils Relative: 0 % (ref 0–5)
HCT: 42.8 % (ref 39.0–52.0)
Hemoglobin: 14.9 g/dL (ref 13.0–17.0)
MCH: 32.5 pg (ref 26.0–34.0)
MCHC: 34.8 g/dL (ref 30.0–36.0)
MCV: 93.4 fL (ref 78.0–100.0)
Monocytes Absolute: 0.4 10*3/uL (ref 0.1–1.0)
Monocytes Relative: 5 % (ref 3–12)
Neutrophils Relative %: 85 % — ABNORMAL HIGH (ref 43–77)

## 2012-02-28 LAB — LIPASE, BLOOD: Lipase: 25 U/L (ref 11–59)

## 2012-02-28 LAB — ETHANOL: Alcohol, Ethyl (B): <11 mg/dL (ref 0–11)

## 2012-02-28 MED ORDER — ONDANSETRON HCL 4 MG/2ML IJ SOLN
4.0000 mg | Freq: Once | INTRAMUSCULAR | Status: AC
  Administered 2012-02-28: 4 mg via INTRAVENOUS
  Filled 2012-02-28: qty 2

## 2012-02-28 MED ORDER — IBUPROFEN 200 MG PO TABS: 600.0000 mg | ORAL_TABLET | Freq: Three times a day (TID) | ORAL | Status: DC | PRN

## 2012-02-28 MED ORDER — ONDANSETRON HCL 8 MG PO TABS
4.0000 mg | ORAL_TABLET | Freq: Three times a day (TID) | ORAL | Status: DC | PRN
  Administered 2012-02-28: 4 mg via ORAL
  Filled 2012-02-28 (×3): qty 1
  Filled 2012-02-28: qty 2

## 2012-02-28 MED ORDER — PANTOPRAZOLE SODIUM 40 MG PO TBEC
40.0000 mg | DELAYED_RELEASE_TABLET | Freq: Once | ORAL | Status: AC
  Administered 2012-02-28: 40 mg via ORAL
  Filled 2012-02-28: qty 1

## 2012-02-28 MED ORDER — LISINOPRIL 10 MG PO TABS
10.0000 mg | ORAL_TABLET | Freq: Once | ORAL | Status: AC
  Administered 2012-02-28: 10 mg via ORAL
  Filled 2012-02-28: qty 1

## 2012-02-28 MED ORDER — ACETAMINOPHEN 325 MG PO TABS
650.0000 mg | ORAL_TABLET | ORAL | Status: DC | PRN
  Filled 2012-02-28: qty 2

## 2012-02-28 MED ORDER — LORAZEPAM 2 MG/ML IJ SOLN
2.0000 mg | Freq: Once | INTRAMUSCULAR | Status: AC
  Administered 2012-02-28: 2 mg via INTRAVENOUS
  Filled 2012-02-28: qty 1

## 2012-02-28 MED ORDER — SODIUM CHLORIDE 0.9 % IV BOLUS (SEPSIS)
1000.0000 mL | Freq: Once | INTRAVENOUS | Status: AC
  Administered 2012-02-28: 1000 mL via INTRAVENOUS

## 2012-02-28 MED ORDER — GLIPIZIDE 5 MG PO TABS
5.0000 mg | ORAL_TABLET | Freq: Two times a day (BID) | ORAL | Status: DC
  Administered 2012-02-28 – 2012-02-29 (×4): 5 mg via ORAL
  Filled 2012-02-28 (×5): qty 1

## 2012-02-28 MED ORDER — METFORMIN HCL 500 MG PO TABS
1000.0000 mg | ORAL_TABLET | Freq: Two times a day (BID) | ORAL | Status: DC
  Administered 2012-02-28 – 2012-02-29 (×3): 1000 mg via ORAL
  Filled 2012-02-28 (×3): qty 2

## 2012-02-28 MED ORDER — LORAZEPAM 1 MG PO TABS
1.0000 mg | ORAL_TABLET | Freq: Three times a day (TID) | ORAL | Status: DC | PRN
  Administered 2012-02-29: 1 mg via ORAL
  Filled 2012-02-28: qty 1

## 2012-02-28 NOTE — ED Provider Notes (Signed)
Pt sleeping with RA sat 95%. 0730  Hurman Horn, MD 02/28/12 [phone]

## 2012-02-28 NOTE — BH Assessment (Signed)
Assessment Note   Samuel Cooke is an 51 y.o. male. Pt request help with drinking problem.  Reports he drinks one gallon of whiskey and 2 40 oz beers daily for past 10 years.   Pt reports constant nausea and vomiting today and also reports shakes.  Pt has had no previous treatment.  Pt also has pending DWI.  Pt denies psych history, denies SI/HI/AV.  Axis I: alcohol dependence Axis II: Deferred Axis III:  Past Medical History  Diagnosis Date  . Diabetes mellitus   . Hypertension   . Hyperlipemia    Axis IV: problems related to legal system/crime Axis V: 51-60 moderate symptoms  Past Medical History:  Past Medical History  Diagnosis Date  . Diabetes mellitus   . Hypertension   . Hyperlipemia     History reviewed. No pertinent past surgical history.  Family History: No family history on file.  Social History:  reports that he has been smoking.  He does not have any smokeless tobacco history on file. He reports that he drinks alcohol. He reports that he does not use illicit drugs.  Additional Social History:  Alcohol / Drug Use Pain Medications: pt denies Prescriptions: pt denies Over the Counter: pt denies History of alcohol / drug use?: Yes Longest period of sobriety (when/how long): 30 days, one year ago Negative Consequences of Use: Legal Withdrawal Symptoms: Tremors;Nausea / Vomiting Substance #1 Name of Substance 1: whiskey/beer 1 - Age of First Use: 15 1 - Amount (size/oz): 1 gallon whiskey, 2 40 oz beers 1 - Frequency: daily 1 - Duration: 10 years 1 - Last Use / Amount: 02/26/12, 1 gallon whiskey, 2 40 oz beers  CIWA: CIWA-Ar BP: 149/86 mmHg Pulse Rate: 103  Nausea and Vomiting: constant nausea, frequent dry heaves and vomiting Tactile Disturbances: none Tremor: two Auditory Disturbances: not present Paroxysmal Sweats: no sweat visible Visual Disturbances: not present Anxiety: no anxiety, at ease Headache, Fullness in Head: none present Agitation:  normal activity Orientation and Clouding of Sensorium: oriented and can do serial additions CIWA-Ar Total: 9  COWS:    Allergies: No Known Allergies  Home Medications:  (Not in a hospital admission)  OB/GYN Status:  No LMP for male patient.  General Assessment Data Location of Assessment: [facility] ACT Assessment: Yes Living Arrangements: Spouse/significant other Can pt return to current living arrangement?: Yes  Education Status Is patient currently in school?: No  Risk to self Suicidal Ideation: No Suicidal Intent: No Is patient at risk for suicide?: No Suicidal Plan?: No Access to Means: No What has been your use of drugs/alcohol within the last 12 months?: current alcohol use Previous Attempts/Gestures: No Intentional Self Injurious Behavior: None Family Suicide History: No Recent stressful life event(s): Other (Comment);Legal Issues (substance abuse) Persecutory voices/beliefs?: No Depression: No Substance abuse history and/or treatment for substance abuse?: Yes Suicide prevention information given to non-admitted patients: Not applicable  Risk to Others Homicidal Ideation: No Thoughts of Harm to Others: No Current Homicidal Intent: No Current Homicidal Plan: No Access to Homicidal Means: No History of harm to others?: No Assessment of Violence: None Noted Does patient have access to weapons?: No Criminal Charges Pending?: Yes Describe Pending Criminal Charges: DWI Does patient have a court date: Yes Court Date: 04/08/12  Psychosis Hallucinations: None noted Delusions: None noted  Mental Status Report Appear/Hygiene: Other (Comment) (casual) Eye Contact: Good Motor Activity: Unremarkable Speech: Logical/coherent Level of Consciousness: Alert Mood: Other (Comment) (pleasant) Affect: Appropriate to circumstance Anxiety Level: None  Thought Processes: Coherent;Relevant Judgement: Unimpaired Orientation: Person;Place;Time;Situation Obsessive Compulsive  Thoughts/Behaviors: None  Cognitive Functioning Concentration: Normal Memory: Recent Intact;Remote Intact IQ: Average Insight: Good Impulse Control: Poor Appetite: Poor Weight Loss: 0  Weight Gain: 0  Sleep: Decreased Total Hours of Sleep: 4  Vegetative Symptoms: None  ADLScreening [facility]) Patient's cognitive ability adequate to safely complete daily activities?: Yes Patient able to express need for assistance with ADLs?: Yes Independently performs ADLs?: Yes  Abuse/Neglect [facility]) Physical Abuse: Denies Verbal Abuse: Denies Sexual Abuse: Denies  Prior Inpatient Therapy Prior Inpatient Therapy: No  Prior Outpatient Therapy Prior Outpatient Therapy: No  ADL Screening (condition at time of admission) Patient's cognitive ability adequate to safely complete daily activities?: Yes Patient able to express need for assistance with ADLs?: Yes Independently performs ADLs?: Yes Weakness of Legs: None Weakness of Arms/Hands: None  Home Assistive Devices/Equipment Home Assistive Devices/Equipment: None    Abuse/Neglect Assessment (Assessment to be complete while patient is alone) Physical Abuse: Denies Verbal Abuse: Denies Sexual Abuse: Denies Exploitation of patient/patient's resources: Denies Self-Neglect: Denies     Merchant navy officer (For Healthcare) Advance Directive: Patient does not have advance directive;Patient would not like information    Additional Information 1:1 In Past 12 Months?: No CIRT Risk: No Elopement Risk: No Does patient have medical clearance?: Yes     Disposition: Discussed pt with Dr Nino Parsley of [facility], will refer to [facility] for detox. Disposition Disposition of Patient: Referred to Patient referred to: [facility]  On Site Evaluation by:   Reviewed with Physician:     Lorri Frederick 02/28/2012 2:20 AM

## 2012-02-28 NOTE — ED Notes (Signed)
Pt asleep. NAD noted. 

## 2012-02-28 NOTE — ED Provider Notes (Signed)
History     CSN: [id number]  Arrival date & time 02/27/12  2304   First MD Initiated Contact with Patient 02/28/12 0014      Chief Complaint  Patient presents with  . Chest Pain    (Consider location/radiation/quality/duration/timing/severity/associated sxs/prior treatment) Patient is a 51 y.o. male presenting with chest pain.  Chest Pain Primary symptoms include cough, nausea and vomiting. Pertinent negatives for primary symptoms include no fever, no shortness of breath and no abdominal pain.  Associated symptoms include diaphoresis.    the patient has diabetes, hypertension, and hypercholesterolemia.  He smokes cigarettes.  He admits that he is an alcoholic.  He complains of intermittent chest pain. That lasts approximately 10 minutes.  He has been occurring since around 5:00 today.  He denies chest pain.  At this time.  He also states that he has a productive cough with sputum.  That is yellow, green, white and sometimes contains blood.  He is nauseated at this time.  He states that he has been vomiting.  All day long.  He also has had diaphoresis He, says his last alcohol was yesterday.  He has not had a fever, chills, leg pain, or leg swelling.  He denies drug use.  Past Medical History  Diagnosis Date  . Diabetes mellitus   . Hypertension   . Hyperlipemia     History reviewed. No pertinent past surgical history.  No family history on file.  History  Substance Use Topics  . Smoking status: Current Everyday Smoker -- 0.5 packs/day  . Smokeless tobacco: Not on file  . Alcohol Use: Yes     "couple 40's"      Review of Systems  Constitutional: Positive for diaphoresis. Negative for fever and chills.  HENT: Negative for neck pain.   Eyes: Negative for redness.  Respiratory: Positive for cough. Negative for choking, chest tightness and shortness of breath.   Cardiovascular: Positive for chest pain. Negative for leg swelling.  Gastrointestinal: Positive for nausea and  vomiting. Negative for abdominal pain and diarrhea.  Skin: Negative for rash.  Neurological: Negative for headaches.  Hematological: Does not bruise/bleed easily.  Psychiatric/Behavioral: Negative for confusion.  All other systems reviewed and are negative.    Allergies  Review of patient's allergies indicates no known allergies.  Home Medications   Current Outpatient Rx  Name Route Sig Dispense Refill  . GLIPIZIDE 5 MG PO TABS Oral Take 5 mg by mouth 2 (two) times daily before a meal.    . LISINOPRIL 10 MG PO TABS Oral Take 10 mg by mouth daily.    Marland Kitchen METFORMIN HCL 1000 MG PO TABS Oral Take 1,000 mg by mouth 2 (two) times daily with a meal.    . PRAVASTATIN SODIUM 20 MG PO TABS Oral Take 20 mg by mouth daily.      BP 149/86  Pulse 103  Temp 99.6 F (37.6 C) (Oral)  Resp 17  SpO2 98%  Physical Exam  Nursing note and vitals reviewed. Constitutional: He is oriented to person, place, and time. He appears well-developed and well-nourished. No distress.  HENT:  Head: Normocephalic and atraumatic.  Eyes: Conjunctivae are normal.  Neck: Normal range of motion. Neck supple.  Cardiovascular:  No murmur heard.      Tachycardia  Pulmonary/Chest: Effort normal. He has no wheezes. He has no rales.  Abdominal: Soft. Bowel sounds are normal. He exhibits no mass. There is no tenderness.  Musculoskeletal: Normal range of motion. He exhibits no edema.  Neurological: He is alert and oriented to person, place, and time.       Tremulous in bilateral upper extremities  Skin: Skin is warm and dry.  Psychiatric: He has a normal mood and affect. Thought content normal.    ED Course  Procedures (including critical care time) 51 year old alcoholic male, presents emergency department with nausea, vomiting, intermittent chest pain, and productive cough.  He is tremulous, and tachycardic, but otherwise in no distress.  I do not think his chest.  Pain is due 2 acute coronary syndrome.  we will  perform a chest x-ray, and laboratory testing.  I will treat him with Ativan.  He states that he wants to get detox from alcohol.  I will consult the act for that purpose.  He denies suicidal or homicidal ideation   Labs Reviewed  CBC WITH DIFFERENTIAL  COMPREHENSIVE METABOLIC PANEL  LIPASE, BLOOD  ETHANOL  URINE RAPID DRUG SCREEN (HOSP PERFORMED)   No results found.   No diagnosis found.  ECG Sinus tachycardia, heart rate 116 beats per minute. Normal axis. Normal intervals. Nonspecific T-wave changes. Impression sinus tachycardia with nonspecific T-wave changes  2:13 AM Reexamined pt after I reviewed xr.  Says his abd hurts " a little bit."   Mild epigastric and ruq ttp. NO RIGIDITY or guarding.  Will do aas.  Spoke with ACT.  They will eval for etoh detox.  MDM  Alcoholism Tachycardia Hyperglycemia abd pain with n/v        Cheri Guppy, MD 02/28/12 [phone]

## 2012-02-28 NOTE — ED Notes (Signed)
Breakfast ordered for pt.

## 2012-02-28 NOTE — ED Notes (Signed)
Meal tray ordered 

## 2012-02-29 NOTE — BH Assessment (Signed)
[facility] Assessment Progress Note  Pt had been accepted to [facility] by Dr. Ferol Luz to Dr. Catha Brow for a detox bed.  When this clinician went in to complete support paperwork with patient, he declined the bed offer.  He said that he had been at [facility] since Friday and he felt fine and wanted to go home to wife.  He responded "no" when asked if he had any SI, HI or A/V hallucinations.  This clinician informed Dr. Ignacia Palma who talked to patient also.  Dr. Ignacia Palma will discharge patient with referrals for a primary care doctor and this clinician provided him with [facility] tx referrals.

## 2012-02-29 NOTE — ED Notes (Signed)
Patient states he has not had any coughing or coughing up blood and denies chest pain since arrival to [facility].

## 2012-02-29 NOTE — Progress Notes (Signed)
8:48 PM Pt has a bed at [facility], but does not want to go.  He has been here three days, feels better, and wants to go home.  He is here voluntarily.  Will give him resources for substance abuse and also for a primary care internist.

## 2012-02-29 NOTE — ED Notes (Addendum)
   See paper medical record/chart for charting on patient done from 02/28/12 at 1730 to 02/29/12 at 0700.   

## 2012-02-29 NOTE — ED Notes (Signed)
Per ACT Team, the patient was advised that he has been accepted to [facility] for detox.  At this time, the patient has declined to accept the offer.  Samuel Cooke to speak the the MD in charge in re the patient's decision.

## 2012-02-29 NOTE — ED Notes (Signed)
Dr. Ignacia Palma spoke with the patient about receiving treatment, however he has decided to decline.

## 2012-02-29 NOTE — ED Provider Notes (Signed)
The patient is concerned about nausea that was present yesterday, and the day prior, following cessation of alcohol 3 days ago. He was able to tolerate breakfast this morning without vomiting.  He is ambulatory and in no distress.  He denies chest pain currently, he did have some 2 nights ago, but it resolved.  He has been evaluated with an EKG, and troponin; both negative.  There is no evidence for an ACS.  I do not believe he is at risk for a cardiac event.  He is medically cleared, stable for discharge from emergency setting to a psychiatric unit.  Flint Melter, MD 02/29/12 [phone]

## 2012-02-29 NOTE — ED Notes (Signed)
The patient is AOx4 and comfortable with his discharge instructions.  He is declining to continue his treatment at [facility] at this time.

## 2012-03-07 DIAGNOSIS — M25569 Pain in unspecified knee: Secondary | ICD-10-CM | POA: Diagnosis not present

## 2012-03-07 DIAGNOSIS — M545 Low back pain: Secondary | ICD-10-CM | POA: Diagnosis not present

## 2012-03-10 DIAGNOSIS — M25569 Pain in unspecified knee: Secondary | ICD-10-CM | POA: Diagnosis not present

## 2012-03-10 DIAGNOSIS — M545 Low back pain: Secondary | ICD-10-CM | POA: Diagnosis not present

## 2012-03-17 DIAGNOSIS — M25569 Pain in unspecified knee: Secondary | ICD-10-CM | POA: Diagnosis not present

## 2012-03-24 DIAGNOSIS — M25569 Pain in unspecified knee: Secondary | ICD-10-CM | POA: Diagnosis not present

## 2012-03-24 DIAGNOSIS — M545 Low back pain: Secondary | ICD-10-CM | POA: Diagnosis not present

## 2012-03-31 DIAGNOSIS — M169 Osteoarthritis of hip, unspecified: Secondary | ICD-10-CM | POA: Diagnosis not present

## 2012-04-04 DIAGNOSIS — F17200 Nicotine dependence, unspecified, uncomplicated: Secondary | ICD-10-CM | POA: Diagnosis not present

## 2012-04-04 DIAGNOSIS — M25559 Pain in unspecified hip: Secondary | ICD-10-CM | POA: Diagnosis not present

## 2012-04-19 DIAGNOSIS — M25559 Pain in unspecified hip: Secondary | ICD-10-CM | POA: Diagnosis not present

## 2013-01-28 DIAGNOSIS — M1710 Unilateral primary osteoarthritis, unspecified knee: Secondary | ICD-10-CM | POA: Diagnosis not present

## 2013-01-28 DIAGNOSIS — M545 Low back pain: Secondary | ICD-10-CM | POA: Diagnosis not present

## 2013-01-28 DIAGNOSIS — M169 Osteoarthritis of hip, unspecified: Secondary | ICD-10-CM | POA: Diagnosis not present

## 2013-07-04 DIAGNOSIS — E785 Hyperlipidemia, unspecified: Secondary | ICD-10-CM | POA: Diagnosis not present

## 2013-07-04 DIAGNOSIS — G894 Chronic pain syndrome: Secondary | ICD-10-CM | POA: Diagnosis not present

## 2013-07-04 DIAGNOSIS — I10 Essential (primary) hypertension: Secondary | ICD-10-CM | POA: Diagnosis not present

## 2013-07-04 DIAGNOSIS — E119 Type 2 diabetes mellitus without complications: Secondary | ICD-10-CM | POA: Diagnosis not present

## 2013-07-04 DIAGNOSIS — F17200 Nicotine dependence, unspecified, uncomplicated: Secondary | ICD-10-CM | POA: Diagnosis not present

## 2013-07-19 ENCOUNTER — Ambulatory Visit: Payer: Medicare Other | Admitting: Family

## 2013-08-02 ENCOUNTER — Ambulatory Visit: Payer: Medicare Other | Admitting: Family

## 2013-08-02 DIAGNOSIS — Z0289 Encounter for other administrative examinations: Secondary | ICD-10-CM

## 2014-11-19 DIAGNOSIS — I10 Essential (primary) hypertension: Secondary | ICD-10-CM | POA: Diagnosis not present

## 2014-11-19 DIAGNOSIS — E559 Vitamin D deficiency, unspecified: Secondary | ICD-10-CM | POA: Diagnosis not present

## 2014-11-19 DIAGNOSIS — E1165 Type 2 diabetes mellitus with hyperglycemia: Secondary | ICD-10-CM | POA: Diagnosis not present

## 2014-11-19 DIAGNOSIS — B359 Dermatophytosis, unspecified: Secondary | ICD-10-CM | POA: Diagnosis not present

## 2014-11-19 DIAGNOSIS — Z23 Encounter for immunization: Secondary | ICD-10-CM | POA: Diagnosis not present

## 2014-11-19 DIAGNOSIS — B372 Candidiasis of skin and nail: Secondary | ICD-10-CM | POA: Diagnosis not present

## 2014-12-25 DIAGNOSIS — E1165 Type 2 diabetes mellitus with hyperglycemia: Secondary | ICD-10-CM | POA: Diagnosis not present

## 2014-12-25 DIAGNOSIS — B359 Dermatophytosis, unspecified: Secondary | ICD-10-CM | POA: Diagnosis not present

## 2014-12-25 DIAGNOSIS — I10 Essential (primary) hypertension: Secondary | ICD-10-CM | POA: Diagnosis not present

## 2014-12-25 DIAGNOSIS — Z136 Encounter for screening for cardiovascular disorders: Secondary | ICD-10-CM | POA: Diagnosis not present

## 2015-03-28 DIAGNOSIS — I10 Essential (primary) hypertension: Secondary | ICD-10-CM | POA: Diagnosis not present

## 2015-03-28 DIAGNOSIS — E1165 Type 2 diabetes mellitus with hyperglycemia: Secondary | ICD-10-CM | POA: Diagnosis not present

## 2015-03-28 DIAGNOSIS — M549 Dorsalgia, unspecified: Secondary | ICD-10-CM | POA: Diagnosis not present

## 2015-03-28 DIAGNOSIS — E559 Vitamin D deficiency, unspecified: Secondary | ICD-10-CM | POA: Diagnosis not present

## 2015-03-28 DIAGNOSIS — E785 Hyperlipidemia, unspecified: Secondary | ICD-10-CM | POA: Diagnosis not present

## 2015-05-13 DIAGNOSIS — H11133 Conjunctival pigmentations, bilateral: Secondary | ICD-10-CM | POA: Diagnosis not present

## 2015-05-13 DIAGNOSIS — E11319 Type 2 diabetes mellitus with unspecified diabetic retinopathy without macular edema: Secondary | ICD-10-CM | POA: Diagnosis not present

## 2015-05-13 DIAGNOSIS — H3563 Retinal hemorrhage, bilateral: Secondary | ICD-10-CM | POA: Diagnosis not present

## 2015-05-13 DIAGNOSIS — Z0100 Encounter for examination of eyes and vision without abnormal findings: Secondary | ICD-10-CM | POA: Diagnosis not present

## 2015-05-13 DIAGNOSIS — H35043 Retinal micro-aneurysms, unspecified, bilateral: Secondary | ICD-10-CM | POA: Diagnosis not present

## 2015-06-28 DIAGNOSIS — E1165 Type 2 diabetes mellitus with hyperglycemia: Secondary | ICD-10-CM | POA: Diagnosis not present

## 2015-06-28 DIAGNOSIS — E559 Vitamin D deficiency, unspecified: Secondary | ICD-10-CM | POA: Diagnosis not present

## 2015-07-15 DIAGNOSIS — M549 Dorsalgia, unspecified: Secondary | ICD-10-CM | POA: Diagnosis not present

## 2015-07-15 DIAGNOSIS — Z23 Encounter for immunization: Secondary | ICD-10-CM | POA: Diagnosis not present

## 2015-07-15 DIAGNOSIS — I10 Essential (primary) hypertension: Secondary | ICD-10-CM | POA: Diagnosis not present

## 2015-07-15 DIAGNOSIS — E1165 Type 2 diabetes mellitus with hyperglycemia: Secondary | ICD-10-CM | POA: Diagnosis not present

## 2015-07-22 DIAGNOSIS — R262 Difficulty in walking, not elsewhere classified: Secondary | ICD-10-CM | POA: Diagnosis not present

## 2015-07-22 DIAGNOSIS — M25551 Pain in right hip: Secondary | ICD-10-CM | POA: Diagnosis not present

## 2015-07-22 DIAGNOSIS — M545 Low back pain: Secondary | ICD-10-CM | POA: Diagnosis not present

## 2015-07-22 DIAGNOSIS — M150 Primary generalized (osteo)arthritis: Secondary | ICD-10-CM | POA: Diagnosis not present

## 2015-08-13 DIAGNOSIS — M25551 Pain in right hip: Secondary | ICD-10-CM | POA: Diagnosis not present

## 2015-08-13 DIAGNOSIS — M545 Low back pain: Secondary | ICD-10-CM | POA: Diagnosis not present

## 2015-08-13 DIAGNOSIS — M150 Primary generalized (osteo)arthritis: Secondary | ICD-10-CM | POA: Diagnosis not present

## 2015-08-15 DIAGNOSIS — M549 Dorsalgia, unspecified: Secondary | ICD-10-CM | POA: Diagnosis not present

## 2015-08-15 DIAGNOSIS — E1165 Type 2 diabetes mellitus with hyperglycemia: Secondary | ICD-10-CM | POA: Diagnosis not present

## 2015-08-15 DIAGNOSIS — M79671 Pain in right foot: Secondary | ICD-10-CM | POA: Diagnosis not present

## 2015-08-16 DIAGNOSIS — M150 Primary generalized (osteo)arthritis: Secondary | ICD-10-CM | POA: Diagnosis not present

## 2015-08-16 DIAGNOSIS — R262 Difficulty in walking, not elsewhere classified: Secondary | ICD-10-CM | POA: Diagnosis not present

## 2015-08-16 DIAGNOSIS — M25551 Pain in right hip: Secondary | ICD-10-CM | POA: Diagnosis not present

## 2015-08-16 DIAGNOSIS — M545 Low back pain: Secondary | ICD-10-CM | POA: Diagnosis not present

## 2015-10-10 ENCOUNTER — Other Ambulatory Visit: Payer: Self-pay | Admitting: Family Medicine

## 2015-10-10 ENCOUNTER — Ambulatory Visit
Admission: RE | Admit: 2015-10-10 | Discharge: 2015-10-10 | Disposition: A | Discharge status: Home / Self Care | Payer: Commercial Managed Care - HMO | Source: Ambulatory Visit | Attending: Family Medicine | Admitting: Family Medicine

## 2015-10-10 DIAGNOSIS — M79671 Pain in right foot: Secondary | ICD-10-CM | POA: Diagnosis not present

## 2015-10-14 DIAGNOSIS — E1165 Type 2 diabetes mellitus with hyperglycemia: Secondary | ICD-10-CM | POA: Diagnosis not present

## 2015-10-14 DIAGNOSIS — F411 Generalized anxiety disorder: Secondary | ICD-10-CM | POA: Diagnosis not present

## 2015-10-14 DIAGNOSIS — K219 Gastro-esophageal reflux disease without esophagitis: Secondary | ICD-10-CM | POA: Diagnosis not present

## 2015-10-14 DIAGNOSIS — I10 Essential (primary) hypertension: Secondary | ICD-10-CM | POA: Diagnosis not present

## 2015-11-18 ENCOUNTER — Ambulatory Visit (INDEPENDENT_AMBULATORY_CARE_PROVIDER_SITE_OTHER): Payer: Commercial Managed Care - HMO

## 2015-11-18 ENCOUNTER — Ambulatory Visit (HOSPITAL_COMMUNITY)
Admission: EM | Admit: 2015-11-18 | Discharge: 2015-11-18 | Disposition: A | Discharge status: Home / Self Care | Payer: Commercial Managed Care - HMO | Attending: Emergency Medicine | Admitting: Emergency Medicine

## 2015-11-18 DIAGNOSIS — M25522 Pain in left elbow: Secondary | ICD-10-CM | POA: Diagnosis not present

## 2015-11-18 DIAGNOSIS — M7712 Lateral epicondylitis, left elbow: Secondary | ICD-10-CM

## 2015-11-18 MED ORDER — PREDNISONE 50 MG PO TABS: ORAL_TABLET | ORAL | Status: DC

## 2015-11-18 MED ORDER — DICLOFENAC SODIUM 50 MG PO TBEC: 50.0000 mg | DELAYED_RELEASE_TABLET | Freq: Three times a day (TID) | ORAL | Status: DC | PRN

## 2015-11-18 NOTE — ED Provider Notes (Signed)
CSN: [id number]     Arrival date & time 11/18/15  1304 History   First MD Initiated Contact with Patient 11/18/15 1325     Chief Complaint  Patient presents with  . Elbow Pain    left   (Consider location/radiation/quality/duration/timing/severity/associated sxs/prior Treatment) HPI  A 55 year old man here for evaluation of left elbow pain. He denies any injury or trauma. He does report carrying the heavy laundry bag up some stairs for the first time yesterday. He states he maybe felt a slight pull at that time, but didn't have any pain until he got home later that evening.  The pain is in the lateral elbow. He denies any pain in his wrist or shoulder. He has applied ice without much improvement. He has not tried any medications. Pain is worse with wrist extension and elbow extension. He is preferentially holding his elbow at 90.  Past Medical History  Diagnosis Date  . Diabetes mellitus   . Hypertension   . Hyperlipemia    No past surgical history on file. No family history on file. Social History  Substance Use Topics  . Smoking status: Current Every Day Smoker -- 0.50 packs/day  . Smokeless tobacco: Not on file  . Alcohol Use: Yes     Comment: "couple 40's"    Review of Systems As in history of present illness Allergies  Review of patient's allergies indicates no known allergies.  Home Medications   Prior to Admission medications   Medication Sig Start Date End Date Taking? Authorizing Provider  diclofenac (VOLTAREN) 50 MG EC tablet Take 1 tablet (50 mg total) by mouth 3 (three) times daily as needed for moderate pain. 11/18/15   Melony Overly, MD  glipiZIDE (GLUCOTROL) 5 MG tablet Take 5 mg by mouth 2 (two) times daily before a meal.    Historical Provider, MD  lisinopril (PRINIVIL,ZESTRIL) 10 MG tablet Take 10 mg by mouth daily.    Historical Provider, MD  metFORMIN (GLUCOPHAGE) 1000 MG tablet Take 1,000 mg by mouth 2 (two) times daily with a meal.    Historical Provider,  MD  pravastatin (PRAVACHOL) 20 MG tablet Take 20 mg by mouth daily.    Historical Provider, MD  predniSONE (DELTASONE) 50 MG tablet Take 1 pill daily for 5 days. 11/18/15   Melony Overly, MD   Meds Ordered and Administered this Visit  Medications - No data to display  BP 121/78 mmHg  Pulse 103  Temp(Src) 98.7 F (37.1 C) (Oral)  SpO2 99% No data found.   Physical Exam  Constitutional: He is oriented to person, place, and time. He appears well-developed and well-nourished. No distress.  Cardiovascular:  Mild tachycardia  Pulmonary/Chest: Effort normal.  Musculoskeletal:  Left elbow: No erythema or edema. No bruising. He is holding the elbow at 90. He will voluntarily move the elbow approximately 15. I am able to fully extend the elbow, but it causes a fair amount of discomfort. No tenderness over the medial epicondyles or olecranon. He is exquisitely tender over the lateral epicondyles as well as the proximal extensor tendons. He has full range of motion in the wrist, but pain with extension, particularly resisted extension.  Neurological: He is alert and oriented to person, place, and time.    ED Course  Procedures (including critical care time)  Labs Review Labs Reviewed - No data to display  Imaging Review Dg Elbow Complete Left  11/18/2015  CLINICAL DATA:  Left elbow pain post lifting bag of clothes yesterday  EXAM: LEFT ELBOW - COMPLETE 3+ VIEW COMPARISON:  None. FINDINGS: Four views of the left elbow submitted. No acute fracture or subluxation. No radiopaque foreign body. No posterior fat pad sign. IMPRESSION: Negative. Electronically Signed   By: Lahoma Crocker M.D.   On: 11/18/2015 13:56     MDM   1. Lateral epicondylitis (tennis elbow), left    Very acute onset. Sling given for comfort. Discussed importance of range of motion to prevent stiffness. Treat with prednisone for 5 days. Diclofenac 3 times a day as needed for pain. Recommended frequent icing. Follow-up with  orthopedics if not improving in the next week.    Melony Overly, MD 11/18/15 [phone]

## 2015-11-18 NOTE — Discharge Instructions (Signed)
You have tennis elbow. Use the sling for comfort. Make sure you move your elbow several times a day so it doesn't get stiff. Take prednisone daily for 5 days. This may cause an increase in your blood sugar. Take diclofenac 3 times a day as needed for pain. Continue to ice your elbow several times a day. If this is not improving in 1 week, follow-up with Dr. Berenice Primas in orthopedics.   Tennis Elbow Tennis elbow is puffiness (inflammation) of the outer tendons of your forearm close to your elbow. Your tendons attach your muscles to your bones. Tennis elbow can happen in any sport or job in which you use your elbow too much. It is caused by doing the same motion over and over. Tennis elbow can cause:  Pain and tenderness in your forearm and the outer part of your elbow.  A burning feeling. This runs from your elbow through your arm.  Weak grip in your hands. HOME CARE Activity  Rest your elbow and wrist as told by your doctor. Try to avoid any activities that caused the problem until your doctor says that you can do them again.  If a physical therapist teaches you exercises, do all of them as told.  If you lift an object, lift it with your palm facing up. This is easier on your elbow. Lifestyle  If your tennis elbow is caused by sports, check your equipment and make sure that:  You are using it correctly.  It fits you well.  If your tennis elbow is caused by work, take breaks often, if you are able. Talk with your manager about doing your work in a way that is safe for you.  If your tennis elbow is caused by computer use, talk with your manager about any changes that can be made to your work setup. General Instructions  If told, apply ice to the painful area:  Put ice in a plastic bag.  Place a towel between your skin and the bag.  Leave the ice on for 20 minutes, 2-3 times per day.  Take medicines only as told by your doctor.  If you were given a brace, wear it as told by  your doctor.  Keep all follow-up visits as told by your doctor. This is important. GET HELP IF:  Your pain does not get better with treatment.  Your pain gets worse.  You have weakness in your forearm, hand, or fingers.  You cannot feel your forearm, hand, or fingers.   This information is not intended to replace advice given to you by your health care provider. Make sure you discuss any questions you have with your health care provider.   Document Released: 01/14/2010 Document Revised: 12/11/2014 Document Reviewed: 07/23/2014 Elsevier Interactive Patient Education [employer].

## 2015-11-18 NOTE — ED Notes (Signed)
Pt stated that he picked up a bag of clothing yesterday but did not feel any pain or anything but today he is having pain in his left elbow Pt has not taken anything for the pain Pt alert and oriented

## 2015-11-28 DIAGNOSIS — K219 Gastro-esophageal reflux disease without esophagitis: Secondary | ICD-10-CM | POA: Diagnosis not present

## 2015-12-11 DIAGNOSIS — Z6829 Body mass index (BMI) 29.0-29.9, adult: Secondary | ICD-10-CM | POA: Diagnosis not present

## 2015-12-11 DIAGNOSIS — Z720 Tobacco use: Secondary | ICD-10-CM | POA: Diagnosis not present

## 2015-12-11 DIAGNOSIS — I10 Essential (primary) hypertension: Secondary | ICD-10-CM | POA: Diagnosis not present

## 2015-12-11 DIAGNOSIS — K219 Gastro-esophageal reflux disease without esophagitis: Secondary | ICD-10-CM | POA: Diagnosis not present

## 2016-01-13 DIAGNOSIS — I10 Essential (primary) hypertension: Secondary | ICD-10-CM | POA: Diagnosis not present

## 2016-01-13 DIAGNOSIS — Z6829 Body mass index (BMI) 29.0-29.9, adult: Secondary | ICD-10-CM | POA: Diagnosis not present

## 2016-01-13 DIAGNOSIS — K219 Gastro-esophageal reflux disease without esophagitis: Secondary | ICD-10-CM | POA: Diagnosis not present

## 2016-01-13 DIAGNOSIS — E1165 Type 2 diabetes mellitus with hyperglycemia: Secondary | ICD-10-CM | POA: Diagnosis not present

## 2016-01-28 DIAGNOSIS — E1165 Type 2 diabetes mellitus with hyperglycemia: Secondary | ICD-10-CM | POA: Diagnosis not present

## 2016-02-24 DIAGNOSIS — E1165 Type 2 diabetes mellitus with hyperglycemia: Secondary | ICD-10-CM | POA: Diagnosis not present

## 2016-03-16 DIAGNOSIS — Z1211 Encounter for screening for malignant neoplasm of colon: Secondary | ICD-10-CM | POA: Diagnosis not present

## 2016-03-16 DIAGNOSIS — E1165 Type 2 diabetes mellitus with hyperglycemia: Secondary | ICD-10-CM | POA: Diagnosis not present

## 2016-03-16 DIAGNOSIS — I10 Essential (primary) hypertension: Secondary | ICD-10-CM | POA: Diagnosis not present

## 2016-03-16 DIAGNOSIS — Z0000 Encounter for general adult medical examination without abnormal findings: Secondary | ICD-10-CM | POA: Diagnosis not present

## 2016-03-23 ENCOUNTER — Encounter: Payer: Self-pay | Admitting: Gastroenterology

## 2016-04-09 IMAGING — CR DG FOOT COMPLETE 3+V*R*
3 series · 3 of 3 positions shown · non-contrast
Comparison: No prior.

CLINICAL DATA: Pain base of fifth metatarsal.  Initial evaluation.

EXAM:
RIGHT FOOT COMPLETE - 3+ VIEW

[t foot ap right]
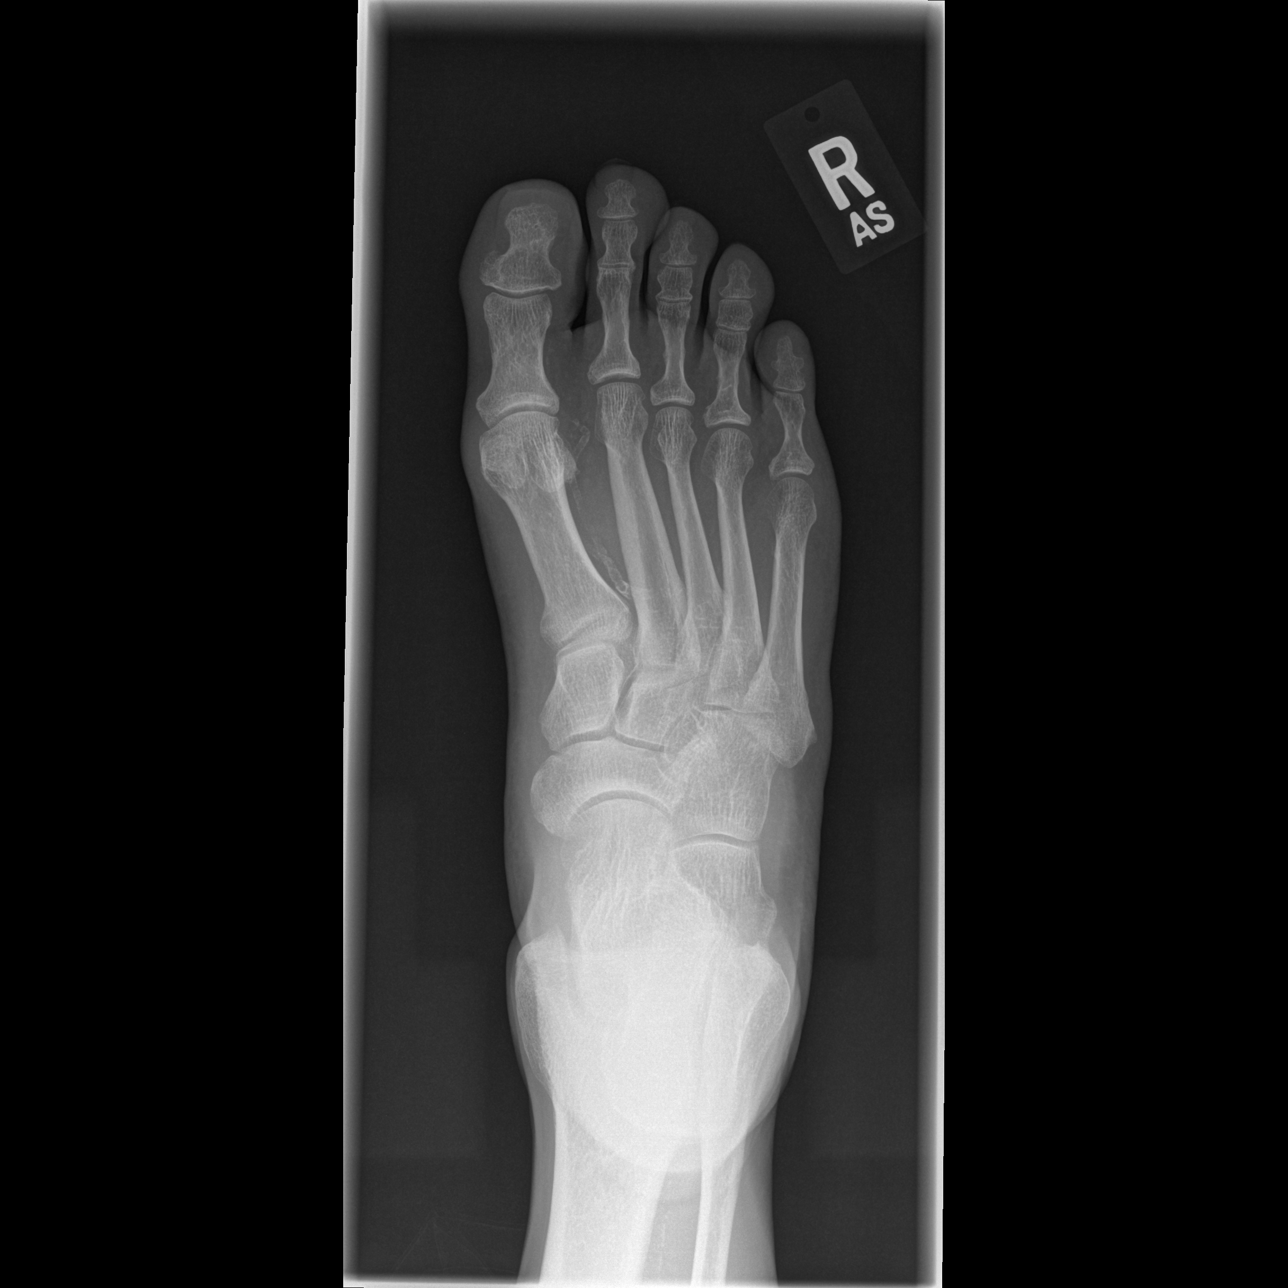

[t foot oblique right]
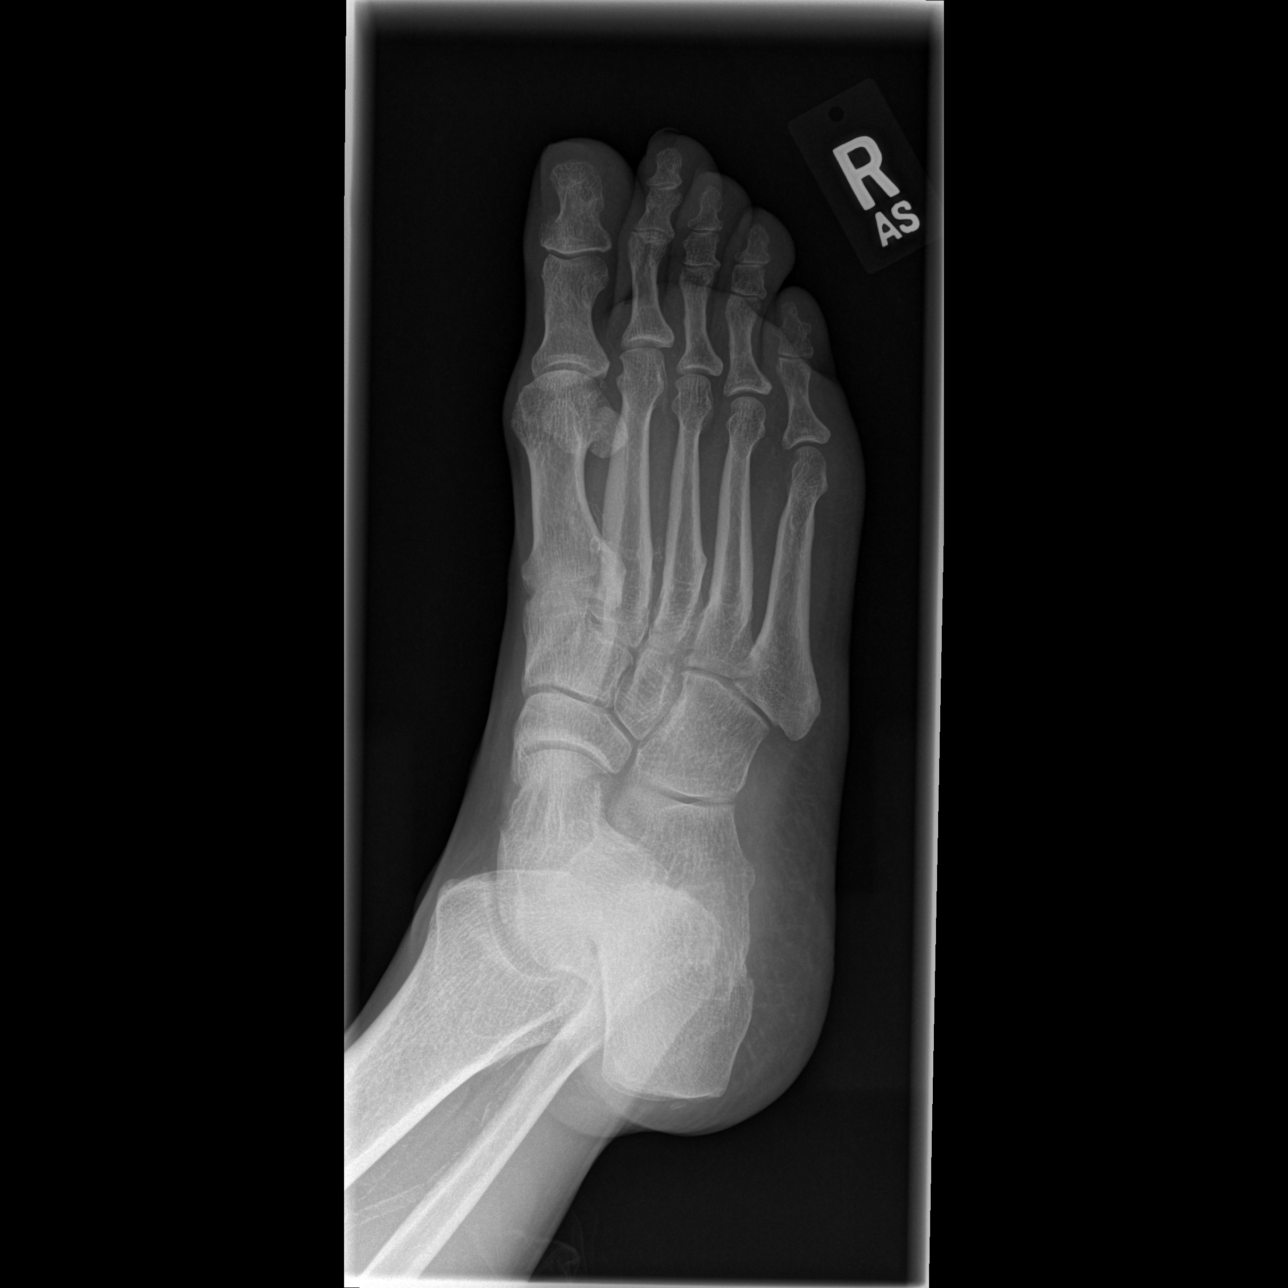

[t foot lat right]
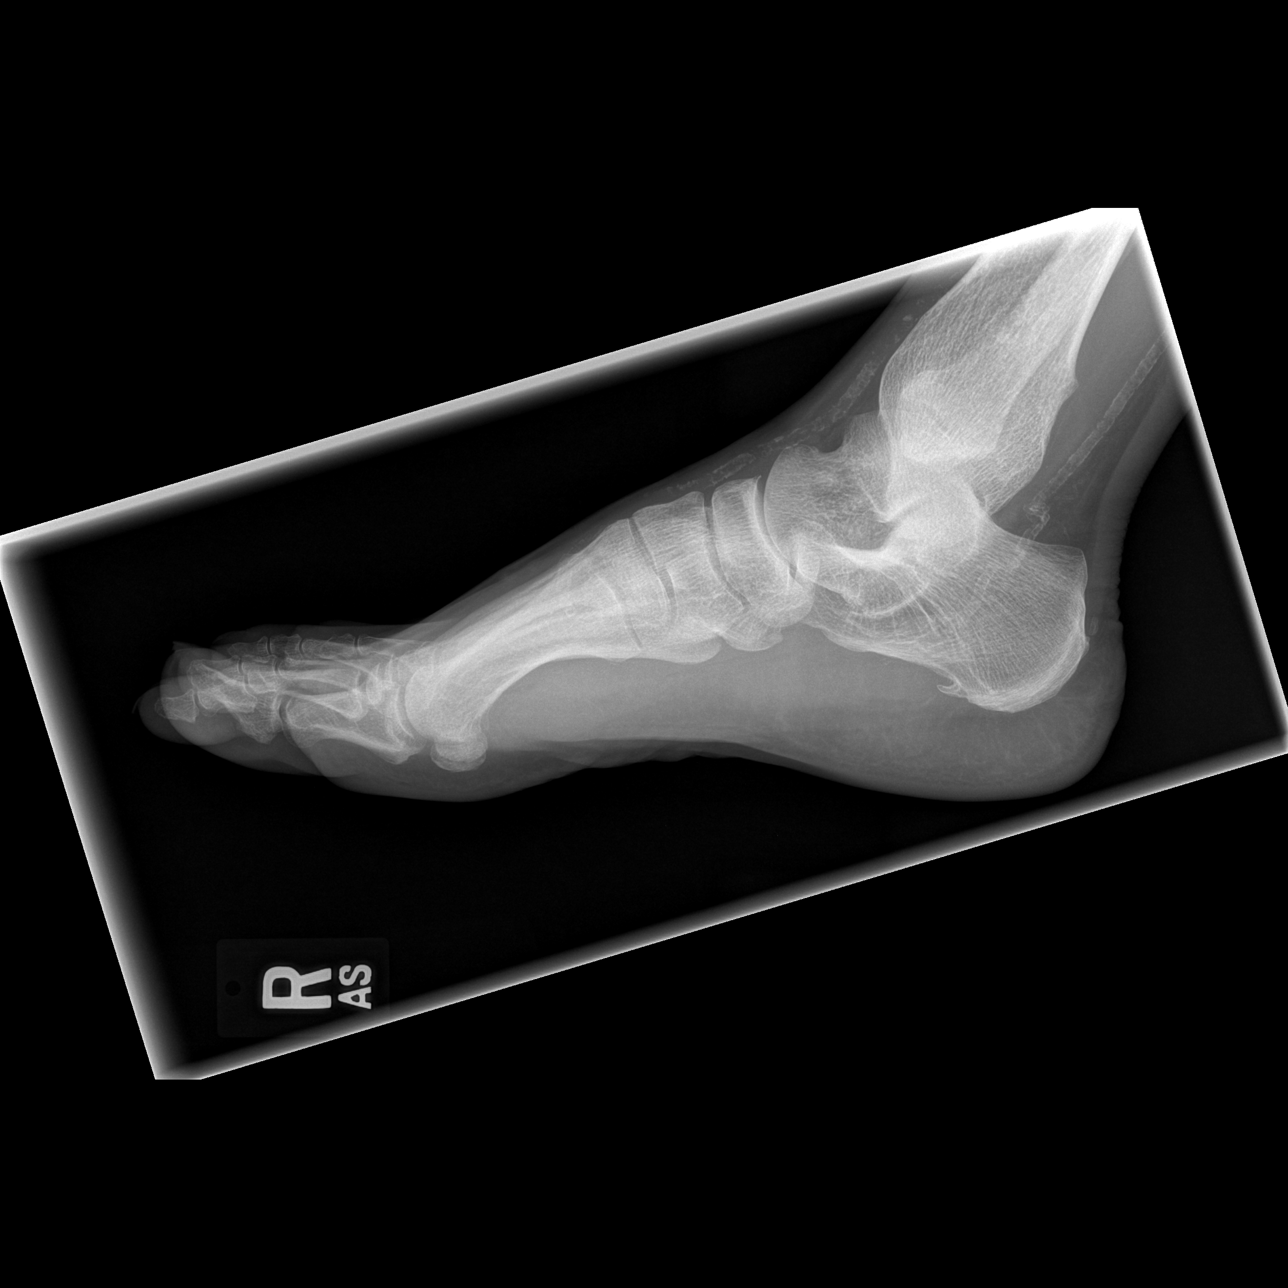

[3 of 3 positions shown; findings below may reference images not displayed]

FINDINGS: No acute bony or joint abnormality identified. No evidence of acute
fracture or dislocation. Peripheral vascular calcification.
IMPRESSION: Over 1 No acute abnormality. No evidence of acute fracture or
dislocation.

2.  Peripheral vascular disease.

## 2016-04-21 DIAGNOSIS — E1165 Type 2 diabetes mellitus with hyperglycemia: Secondary | ICD-10-CM | POA: Diagnosis not present

## 2016-04-21 DIAGNOSIS — Z79899 Other long term (current) drug therapy: Secondary | ICD-10-CM | POA: Diagnosis not present

## 2016-04-21 DIAGNOSIS — E785 Hyperlipidemia, unspecified: Secondary | ICD-10-CM | POA: Diagnosis not present

## 2016-04-21 DIAGNOSIS — I10 Essential (primary) hypertension: Secondary | ICD-10-CM | POA: Diagnosis not present

## 2016-04-23 DIAGNOSIS — I639 Cerebral infarction, unspecified: Secondary | ICD-10-CM | POA: Diagnosis not present

## 2016-04-23 DIAGNOSIS — Z23 Encounter for immunization: Secondary | ICD-10-CM | POA: Diagnosis not present

## 2016-04-23 DIAGNOSIS — E782 Mixed hyperlipidemia: Secondary | ICD-10-CM | POA: Diagnosis not present

## 2016-04-23 DIAGNOSIS — E1165 Type 2 diabetes mellitus with hyperglycemia: Secondary | ICD-10-CM | POA: Diagnosis not present

## 2016-04-23 DIAGNOSIS — I10 Essential (primary) hypertension: Secondary | ICD-10-CM | POA: Diagnosis not present

## 2016-05-13 DIAGNOSIS — E113292 Type 2 diabetes mellitus with mild nonproliferative diabetic retinopathy without macular edema, left eye: Secondary | ICD-10-CM | POA: Diagnosis not present

## 2016-05-13 DIAGNOSIS — E113293 Type 2 diabetes mellitus with mild nonproliferative diabetic retinopathy without macular edema, bilateral: Secondary | ICD-10-CM | POA: Diagnosis not present

## 2016-05-13 DIAGNOSIS — H5213 Myopia, bilateral: Secondary | ICD-10-CM | POA: Diagnosis not present

## 2016-05-13 DIAGNOSIS — H2513 Age-related nuclear cataract, bilateral: Secondary | ICD-10-CM | POA: Diagnosis not present

## 2016-05-13 DIAGNOSIS — E113291 Type 2 diabetes mellitus with mild nonproliferative diabetic retinopathy without macular edema, right eye: Secondary | ICD-10-CM | POA: Diagnosis not present

## 2016-05-18 DIAGNOSIS — N471 Phimosis: Secondary | ICD-10-CM | POA: Diagnosis not present

## 2016-05-18 DIAGNOSIS — N400 Enlarged prostate without lower urinary tract symptoms: Secondary | ICD-10-CM | POA: Diagnosis not present

## 2016-05-19 ENCOUNTER — Encounter: Payer: Self-pay | Admitting: Gastroenterology

## 2016-05-19 ENCOUNTER — Ambulatory Visit (AMBULATORY_SURGERY_CENTER): Payer: Self-pay | Admitting: *Deleted

## 2016-05-19 VITALS — Ht 66.0 in | Wt 192.0 lb

## 2016-05-19 DIAGNOSIS — Z1211 Encounter for screening for malignant neoplasm of colon: Secondary | ICD-10-CM

## 2016-05-19 MED ORDER — NA SULFATE-K SULFATE-MG SULF 17.5-3.13-1.6 GM/177ML PO SOLN: 1.0000 | Freq: Once | ORAL | 0 refills | Status: AC

## 2016-05-19 NOTE — Progress Notes (Signed)
No egg or soy allergy known to patient  No issues with past sedation with any surgeries  or procedures, no intubation problems  No diet pills per patient No home 02 use per patient  No blood thinners per patient  Pt denies issues with constipation  No A fib or A flutter   

## 2016-05-20 ENCOUNTER — Other Ambulatory Visit: Payer: Self-pay | Admitting: Urology

## 2016-06-02 ENCOUNTER — Encounter: Payer: Self-pay | Admitting: Gastroenterology

## 2016-06-02 ENCOUNTER — Ambulatory Visit (AMBULATORY_SURGERY_CENTER): Payer: Commercial Managed Care - HMO | Admitting: Gastroenterology

## 2016-06-02 VITALS — BP 109/69 | HR 84 | Temp 99.1°F | Resp 37 | Ht 66.0 in | Wt 192.0 lb

## 2016-06-02 DIAGNOSIS — Z1211 Encounter for screening for malignant neoplasm of colon: Secondary | ICD-10-CM

## 2016-06-02 DIAGNOSIS — Z1212 Encounter for screening for malignant neoplasm of rectum: Secondary | ICD-10-CM | POA: Diagnosis not present

## 2016-06-02 DIAGNOSIS — D124 Benign neoplasm of descending colon: Secondary | ICD-10-CM

## 2016-06-02 DIAGNOSIS — D122 Benign neoplasm of ascending colon: Secondary | ICD-10-CM | POA: Diagnosis not present

## 2016-06-02 DIAGNOSIS — E119 Type 2 diabetes mellitus without complications: Secondary | ICD-10-CM | POA: Diagnosis not present

## 2016-06-02 DIAGNOSIS — I10 Essential (primary) hypertension: Secondary | ICD-10-CM | POA: Diagnosis not present

## 2016-06-02 HISTORY — PX: COLONOSCOPY: SHX174

## 2016-06-02 LAB — GLUCOSE, CAPILLARY
GLUCOSE-CAPILLARY: 281 mg/dL — AB (ref 65–99)
Glucose-Capillary: 307 mg/dL — ABNORMAL HIGH (ref 65–99)

## 2016-06-02 MED ORDER — SODIUM CHLORIDE 0.9 % IV SOLN: 500.0000 mL | INTRAVENOUS | Status: DC

## 2016-06-02 NOTE — Op Note (Signed)
[place] Patient Name: Samuel Cooke Procedure Date: 06/02/2016 11:10 AM MRN: [id number] Endoscopist: Remo Lipps P. Armbruster MD, MD Age: 55 Referring MD:  Date of Birth: [birth year] Gender: Male Account #: [id number] Procedure:                Colonoscopy Indications:              Screening for malignant neoplasm in the colon, This                            is the patient's first colonoscopy Medicines:                Monitored Anesthesia Care Procedure:                Pre-Anesthesia Assessment:                           - Prior to the procedure, a History and Physical                            was performed, and patient medications and                            allergies were reviewed. The patient's tolerance of                            previous anesthesia was also reviewed. The risks                            and benefits of the procedure and the sedation                            options and risks were discussed with the patient.                            All questions were answered, and informed consent                            was obtained. Prior Anticoagulants: The patient has                            taken no previous anticoagulant or antiplatelet                            agents. ASA Grade Assessment: III - A patient with                            severe systemic disease. After reviewing the risks                            and benefits, the patient was deemed in                            satisfactory condition to undergo the procedure.  After obtaining informed consent, the colonoscope                            was passed under direct vision. Throughout the                            procedure, the patient's blood pressure, pulse, and                            oxygen saturations were monitored continuously. The                            Model CF-HQ190L [id number]) scope was introduced                            through  the anus and advanced to the the cecum,                            identified by appendiceal orifice and ileocecal                            valve. The colonoscopy was performed without                            difficulty. The patient tolerated the procedure                            well. The quality of the bowel preparation was                            good. The ileocecal valve, appendiceal orifice, and                            rectum were photographed. Scope In: 11:15:20 AM Scope Out: 11:33:35 AM Scope Withdrawal Time: 0 hours 15 minutes 24 seconds  Total Procedure Duration: 0 hours 18 minutes 15 seconds  Findings:                 The perianal and digital rectal examinations were                            normal.                           A 4 mm polyp was found in the ascending colon. The                            polyp was sessile. The polyp was removed with a                            cold snare. Resection and retrieval were complete.                           A 5 mm polyp was found in the descending colon. The  polyp was sessile. The polyp was removed with a                            cold snare. Resection and retrieval were complete.                           Many medium-mouthed diverticula were found in the                            entire colon.                           Non-bleeding internal hemorrhoids were found during                            retroflexion.                           The exam was otherwise without abnormality. Complications:            No immediate complications. Estimated blood loss:                            Minimal. Estimated Blood Loss:     Estimated blood loss was minimal. Impression:               - One 4 mm polyp in the ascending colon, removed                            with a cold snare. Resected and retrieved.                           - One 5 mm polyp in the descending colon, removed                             with a cold snare. Resected and retrieved.                           - Diverticulosis in the entire examined colon.                           - Non-bleeding internal hemorrhoids.                           - The examination was otherwise normal. Recommendation:           - Patient has a contact number available for                            emergencies. The signs and symptoms of potential                            delayed complications were discussed with the                            patient. Return to normal activities tomorrow.  Written discharge instructions were provided to the                            patient.                           - Resume previous diet.                           - Continue present medications.                           - No ibuprofen, naproxen, or other non-steroidal                            anti-inflammatory drugs for 2 weeks after polyp                            removal.                           - Await pathology results.                           - Repeat colonoscopy is recommended for                            surveillance. The colonoscopy date will be                            determined after pathology results from today's                            exam become available for review. Remo Lipps P. Armbruster MD, MD 06/02/2016 11:36:59 AM This report has been signed electronically.

## 2016-06-02 NOTE — Patient Instructions (Signed)
YOU HAD AN ENDOSCOPIC PROCEDURE TODAY AT [facility]:   Refer to the procedure report that was given to you for any specific questions about what was found during the examination.  If the procedure report does not answer your questions, please call your gastroenterologist to clarify.  If you requested that your care partner not be given the details of your procedure findings, then the procedure report has been included in a sealed envelope for you to review at your convenience later.  YOU SHOULD EXPECT: Some feelings of bloating in the abdomen. Passage of more gas than usual.  Walking can help get rid of the air that was put into your GI tract during the procedure and reduce the bloating. If you had a lower endoscopy (such as a colonoscopy or flexible sigmoidoscopy) you may notice spotting of blood in your stool or on the toilet paper. If you underwent a bowel prep for your procedure, you may not have a normal bowel movement for a few days.  Please Note:  You might notice some irritation and congestion in your nose or some drainage.  This is from the oxygen used during your procedure.  There is no need for concern and it should clear up in a day or so.  SYMPTOMS TO REPORT IMMEDIATELY:   Following lower endoscopy (colonoscopy or flexible sigmoidoscopy):  Excessive amounts of blood in the stool  Significant tenderness or worsening of abdominal pains  Swelling of the abdomen that is new, acute  Fever of 100F or higher   Following upper endoscopy (EGD)  Vomiting of blood or coffee ground material  New chest pain or pain under the shoulder blades  Painful or persistently difficult swallowing  New shortness of breath  Fever of 100F or higher  Black, tarry-looking stools  For urgent or emergent issues, a gastroenterologist can be reached at any hour by calling [phone].   DIET:  We do recommend a small meal at first, but then you may proceed to your regular diet.  Drink  plenty of fluids but you should avoid alcoholic beverages for 24 hours.  ACTIVITY:  You should plan to take it easy for the rest of today and you should NOT DRIVE or use heavy machinery until tomorrow (because of the sedation medicines used during the test).    FOLLOW UP: Our staff will call the number listed on your records the next business day following your procedure to check on you and address any questions or concerns that you may have regarding the information given to you following your procedure. If we do not reach you, we will leave a message.  However, if you are feeling well and you are not experiencing any problems, there is no need to return our call.  We will assume that you have returned to your regular daily activities without incident.  If any biopsies were taken you will be contacted by phone or by letter within the next 1-3 weeks.  Please call us at [phone] if you have not heard about the biopsies in 3 weeks.    SIGNATURES/CONFIDENTIALITY: You and/or your care partner have signed paperwork which will be entered into your electronic medical record.  These signatures attest to the fact that that the information above on your After Visit Summary has been reviewed and is understood.  Full responsibility of the confidentiality of this discharge information lies with you and/or your care-partner.  Polyp, diverticulosis, high fiber diet and hemorrhoid information given.  No  naproxen, ibuprofen, or other non-steroidal anti-inflammatory meds for 2 weeks.

## 2016-06-02 NOTE — Progress Notes (Signed)
Patient awakening,vss,report to rn 

## 2016-06-02 NOTE — Progress Notes (Signed)
Dr Havery Moros notified regarding glucose level of 307 obtained. States it ok.

## 2016-06-03 ENCOUNTER — Telehealth: Payer: Self-pay | Admitting: *Deleted

## 2016-06-03 NOTE — Telephone Encounter (Signed)
  Follow up Call-  Call back number 06/02/2016  Post procedure Call Back phone  # [phone]  Permission to leave phone message Yes  Some recent data might be hidden     Patient questions:  Do you have a fever, pain , or abdominal swelling? No. Pain Score  0 *  Have you tolerated food without any problems? Yes.    Have you been able to return to your normal activities? Yes.    Do you have any questions about your discharge instructions: Diet   No. Medications  No. Follow up visit  No.  Do you have questions or concerns about your Care? No.  Actions: * If pain score is 4 or above: No action needed, pain <4.

## 2016-06-04 ENCOUNTER — Encounter (HOSPITAL_BASED_OUTPATIENT_CLINIC_OR_DEPARTMENT_OTHER): Payer: Self-pay | Admitting: *Deleted

## 2016-06-04 ENCOUNTER — Encounter: Payer: Self-pay | Admitting: Gastroenterology

## 2016-06-04 NOTE — Progress Notes (Signed)
NPO AFTER MN.  ARRIVE AT 0915.  NEEDS EKG.  GETTING LAB WORK DONE Monday 06-08-2016 (CBC, BMET, PTT, PT/INR).

## 2016-06-08 DIAGNOSIS — Z79899 Other long term (current) drug therapy: Secondary | ICD-10-CM | POA: Diagnosis not present

## 2016-06-08 DIAGNOSIS — Z8673 Personal history of transient ischemic attack (TIA), and cerebral infarction without residual deficits: Secondary | ICD-10-CM | POA: Diagnosis not present

## 2016-06-08 DIAGNOSIS — Z794 Long term (current) use of insulin: Secondary | ICD-10-CM | POA: Diagnosis not present

## 2016-06-08 DIAGNOSIS — F17200 Nicotine dependence, unspecified, uncomplicated: Secondary | ICD-10-CM | POA: Diagnosis not present

## 2016-06-08 DIAGNOSIS — I10 Essential (primary) hypertension: Secondary | ICD-10-CM | POA: Diagnosis not present

## 2016-06-08 DIAGNOSIS — K219 Gastro-esophageal reflux disease without esophagitis: Secondary | ICD-10-CM | POA: Diagnosis not present

## 2016-06-08 DIAGNOSIS — E119 Type 2 diabetes mellitus without complications: Secondary | ICD-10-CM | POA: Diagnosis not present

## 2016-06-08 DIAGNOSIS — N400 Enlarged prostate without lower urinary tract symptoms: Secondary | ICD-10-CM | POA: Diagnosis not present

## 2016-06-08 DIAGNOSIS — E7800 Pure hypercholesterolemia, unspecified: Secondary | ICD-10-CM | POA: Diagnosis not present

## 2016-06-08 DIAGNOSIS — N471 Phimosis: Secondary | ICD-10-CM | POA: Diagnosis not present

## 2016-06-08 DIAGNOSIS — F419 Anxiety disorder, unspecified: Secondary | ICD-10-CM | POA: Diagnosis not present

## 2016-06-08 DIAGNOSIS — F329 Major depressive disorder, single episode, unspecified: Secondary | ICD-10-CM | POA: Diagnosis not present

## 2016-06-08 DIAGNOSIS — M1990 Unspecified osteoarthritis, unspecified site: Secondary | ICD-10-CM | POA: Diagnosis not present

## 2016-06-08 LAB — CBC
HEMATOCRIT: 45.1 % (ref 39.0–52.0)
HEMOGLOBIN: 15 g/dL (ref 13.0–17.0)
MCH: 29.6 pg (ref 26.0–34.0)
MCHC: 33.3 g/dL (ref 30.0–36.0)
MCV: 89 fL (ref 78.0–100.0)
Platelets: 194 10*3/uL (ref 150–400)
RBC: 5.07 MIL/uL (ref 4.22–5.81)
RDW: 13.2 % (ref 11.5–15.5)
WBC: 6.6 10*3/uL (ref 4.0–10.5)

## 2016-06-08 LAB — PROTIME-INR
INR: 0.97
Prothrombin Time: 12.9 seconds (ref 11.4–15.2)

## 2016-06-08 LAB — BASIC METABOLIC PANEL
ANION GAP: 11 (ref 5–15)
BUN: 17 mg/dL (ref 6–20)
CO2: 22 mmol/L (ref 22–32)
Calcium: 8.8 mg/dL — ABNORMAL LOW (ref 8.9–10.3)
Chloride: 103 mmol/L (ref 101–111)
Creatinine, Ser: 1.14 mg/dL (ref 0.61–1.24)
GFR calc Af Amer: >60 mL/min (ref >60)
Glucose, Bld: 287 mg/dL — ABNORMAL HIGH (ref 65–99)
POTASSIUM: 3.9 mmol/L (ref 3.5–5.1)
SODIUM: 136 mmol/L (ref 135–145)

## 2016-06-08 LAB — APTT: APTT: 26 s (ref 24–36)

## 2016-06-09 ENCOUNTER — Ambulatory Visit (HOSPITAL_BASED_OUTPATIENT_CLINIC_OR_DEPARTMENT_OTHER): Payer: Commercial Managed Care - HMO | Admitting: Anesthesiology

## 2016-06-09 ENCOUNTER — Encounter (HOSPITAL_BASED_OUTPATIENT_CLINIC_OR_DEPARTMENT_OTHER): Payer: Self-pay | Admitting: *Deleted

## 2016-06-09 ENCOUNTER — Encounter (HOSPITAL_BASED_OUTPATIENT_CLINIC_OR_DEPARTMENT_OTHER)
Admission: RE | Disposition: A | Discharge status: Home / Self Care | Payer: Self-pay | Source: Ambulatory Visit | Attending: Urology

## 2016-06-09 ENCOUNTER — Other Ambulatory Visit: Payer: Self-pay

## 2016-06-09 ENCOUNTER — Ambulatory Visit (HOSPITAL_BASED_OUTPATIENT_CLINIC_OR_DEPARTMENT_OTHER)
Admission: RE | Admit: 2016-06-09 | Discharge: 2016-06-09 | Disposition: A | Discharge status: Home / Self Care | Payer: Commercial Managed Care - HMO | Source: Ambulatory Visit | Attending: Urology | Admitting: Urology

## 2016-06-09 DIAGNOSIS — Z794 Long term (current) use of insulin: Secondary | ICD-10-CM | POA: Insufficient documentation

## 2016-06-09 DIAGNOSIS — F419 Anxiety disorder, unspecified: Secondary | ICD-10-CM | POA: Diagnosis not present

## 2016-06-09 DIAGNOSIS — Z79899 Other long term (current) drug therapy: Secondary | ICD-10-CM | POA: Insufficient documentation

## 2016-06-09 DIAGNOSIS — E119 Type 2 diabetes mellitus without complications: Secondary | ICD-10-CM | POA: Diagnosis not present

## 2016-06-09 DIAGNOSIS — N471 Phimosis: Secondary | ICD-10-CM | POA: Diagnosis not present

## 2016-06-09 DIAGNOSIS — N400 Enlarged prostate without lower urinary tract symptoms: Secondary | ICD-10-CM | POA: Insufficient documentation

## 2016-06-09 DIAGNOSIS — K219 Gastro-esophageal reflux disease without esophagitis: Secondary | ICD-10-CM | POA: Insufficient documentation

## 2016-06-09 DIAGNOSIS — M1990 Unspecified osteoarthritis, unspecified site: Secondary | ICD-10-CM | POA: Diagnosis not present

## 2016-06-09 DIAGNOSIS — E7800 Pure hypercholesterolemia, unspecified: Secondary | ICD-10-CM | POA: Insufficient documentation

## 2016-06-09 DIAGNOSIS — F329 Major depressive disorder, single episode, unspecified: Secondary | ICD-10-CM | POA: Diagnosis not present

## 2016-06-09 DIAGNOSIS — I10 Essential (primary) hypertension: Secondary | ICD-10-CM | POA: Insufficient documentation

## 2016-06-09 DIAGNOSIS — F17200 Nicotine dependence, unspecified, uncomplicated: Secondary | ICD-10-CM | POA: Insufficient documentation

## 2016-06-09 DIAGNOSIS — Z8673 Personal history of transient ischemic attack (TIA), and cerebral infarction without residual deficits: Secondary | ICD-10-CM | POA: Insufficient documentation

## 2016-06-09 HISTORY — DX: Phimosis: N47.1

## 2016-06-09 HISTORY — PX: CIRCUMCISION: SHX1350

## 2016-06-09 HISTORY — DX: Presence of spectacles and contact lenses: Z97.3

## 2016-06-09 HISTORY — DX: Other intervertebral disc degeneration, lumbosacral region: M51.37

## 2016-06-09 HISTORY — DX: Type 2 diabetes mellitus without complications: E11.9

## 2016-06-09 HISTORY — DX: Personal history of transient ischemic attack (TIA), and cerebral infarction without residual deficits: Z86.73

## 2016-06-09 HISTORY — DX: Other intervertebral disc degeneration, lumbosacral region without mention of lumbar back pain or lower extremity pain: M51.379

## 2016-06-09 HISTORY — DX: Hyperlipidemia, unspecified: E78.5

## 2016-06-09 HISTORY — DX: Diverticulosis of large intestine without perforation or abscess without bleeding: K57.30

## 2016-06-09 LAB — GLUCOSE, CAPILLARY
Glucose-Capillary: 316 mg/dL — ABNORMAL HIGH (ref 65–99)
Glucose-Capillary: 282 mg/dL — ABNORMAL HIGH (ref 65–99)

## 2016-06-09 LAB — POCT I-STAT, CHEM 8
BUN: 15 mg/dL (ref 6–20)
CALCIUM ION: 1.15 mmol/L (ref 1.15–1.40)
CREATININE: 0.8 mg/dL (ref 0.61–1.24)
Chloride: 106 mmol/L (ref 101–111)
Glucose, Bld: 307 mg/dL — ABNORMAL HIGH (ref 65–99)
HCT: 47 % (ref 39.0–52.0)
HEMOGLOBIN: 16 g/dL (ref 13.0–17.0)
Potassium: 4.1 mmol/L (ref 3.5–5.1)
Sodium: 142 mmol/L (ref 135–145)
TCO2: 24 mmol/L (ref 0–100)

## 2016-06-09 SURGERY — CIRCUMCISION, ADULT
Anesthesia: General | Site: Penis

## 2016-06-09 MED ORDER — LIDOCAINE 2% (20 MG/ML) 5 ML SYRINGE
INTRAMUSCULAR | Status: DC | PRN
  Administered 2016-06-09: 80 mg via INTRAVENOUS

## 2016-06-09 MED ORDER — FENTANYL CITRATE (PF) 100 MCG/2ML IJ SOLN
INTRAMUSCULAR | Status: DC | PRN
  Administered 2016-06-09 (×2): 50 ug via INTRAVENOUS

## 2016-06-09 MED ORDER — LIDOCAINE 2% (20 MG/ML) 5 ML SYRINGE
INTRAMUSCULAR | Status: AC
  Filled 2016-06-09: qty 5

## 2016-06-09 MED ORDER — CEFAZOLIN SODIUM-DEXTROSE 2-4 GM/100ML-% IV SOLN
INTRAVENOUS | Status: AC
  Filled 2016-06-09: qty 100

## 2016-06-09 MED ORDER — FENTANYL CITRATE (PF) 100 MCG/2ML IJ SOLN
INTRAMUSCULAR | Status: AC
  Filled 2016-06-09: qty 2

## 2016-06-09 MED ORDER — CEFAZOLIN SODIUM-DEXTROSE 2-4 GM/100ML-% IV SOLN
2.0000 g | INTRAVENOUS | Status: AC
  Administered 2016-06-09: 2 g via INTRAVENOUS
  Filled 2016-06-09: qty 100

## 2016-06-09 MED ORDER — INSULIN ASPART 100 UNIT/ML ~~LOC~~ SOLN
SUBCUTANEOUS | Status: DC | PRN
  Administered 2016-06-09: 10 [IU] via SUBCUTANEOUS

## 2016-06-09 MED ORDER — HYDROCODONE-ACETAMINOPHEN 5-325 MG PO TABS: 1.0000 | ORAL_TABLET | ORAL | 0 refills | Status: DC | PRN

## 2016-06-09 MED ORDER — ONDANSETRON HCL 4 MG/2ML IJ SOLN
INTRAMUSCULAR | Status: AC
  Filled 2016-06-09: qty 2

## 2016-06-09 MED ORDER — LABETALOL HCL 5 MG/ML IV SOLN
INTRAVENOUS | Status: AC
  Filled 2016-06-09: qty 4

## 2016-06-09 MED ORDER — ONDANSETRON HCL 4 MG/2ML IJ SOLN
INTRAMUSCULAR | Status: DC | PRN
  Administered 2016-06-09: 4 mg via INTRAVENOUS

## 2016-06-09 MED ORDER — LABETALOL HCL 5 MG/ML IV SOLN
INTRAVENOUS | Status: DC | PRN
  Administered 2016-06-09: 5 mg via INTRAVENOUS

## 2016-06-09 MED ORDER — MIDAZOLAM HCL 5 MG/5ML IJ SOLN
INTRAMUSCULAR | Status: DC | PRN
  Administered 2016-06-09: 2 mg via INTRAVENOUS

## 2016-06-09 MED ORDER — MIDAZOLAM HCL 2 MG/2ML IJ SOLN
INTRAMUSCULAR | Status: AC
  Filled 2016-06-09: qty 2

## 2016-06-09 MED ORDER — PROPOFOL 10 MG/ML IV BOLUS
INTRAVENOUS | Status: AC
  Filled 2016-06-09: qty 20

## 2016-06-09 MED ORDER — LACTATED RINGERS IV SOLN
INTRAVENOUS | Status: DC
  Administered 2016-06-09: 10:00:00 via INTRAVENOUS
  Filled 2016-06-09: qty 1000

## 2016-06-09 MED ORDER — CEPHALEXIN 500 MG PO CAPS: 500.0000 mg | ORAL_CAPSULE | Freq: Three times a day (TID) | ORAL | 0 refills | Status: DC

## 2016-06-09 MED ORDER — BUPIVACAINE HCL 0.5 % IJ SOLN
INTRAMUSCULAR | Status: DC | PRN
  Administered 2016-06-09: 9 mL

## 2016-06-09 MED ORDER — PROPOFOL 10 MG/ML IV BOLUS
INTRAVENOUS | Status: DC | PRN
  Administered 2016-06-09: 50 mg via INTRAVENOUS
  Administered 2016-06-09: 200 mg via INTRAVENOUS
  Administered 2016-06-09: 100 mg via INTRAVENOUS

## 2016-06-09 MED ORDER — ESMOLOL HCL 100 MG/10ML IV SOLN
INTRAVENOUS | Status: AC
  Filled 2016-06-09: qty 10

## 2016-06-09 MED ORDER — ESMOLOL HCL 100 MG/10ML IV SOLN
INTRAVENOUS | Status: DC | PRN
  Administered 2016-06-09 (×2): 10 mg via INTRAVENOUS

## 2016-06-09 SURGICAL SUPPLY — 34 items
BANDAGE COBAN STERILE 2 (GAUZE/BANDAGES/DRESSINGS) ×3 IMPLANT
BLADE HEX COATED 2.75 (ELECTRODE) ×3 IMPLANT
BLADE SURG 15 STRL LF DISP TIS (BLADE) ×1 IMPLANT
BLADE SURG 15 STRL SS (BLADE) ×3
BNDG CONFORM 2 STRL LF (GAUZE/BANDAGES/DRESSINGS) ×3 IMPLANT
COVER BACK TABLE 60X90IN (DRAPES) ×3 IMPLANT
COVER MAYO STAND STRL (DRAPES) ×3 IMPLANT
DECANTER SPIKE VIAL GLASS SM (MISCELLANEOUS) IMPLANT
DRAPE LAPAROTOMY 100X72 PEDS (DRAPES) ×3 IMPLANT
ELECT REM PT RETURN 9FT ADLT (ELECTROSURGICAL) ×3
ELECTRODE REM PT RTRN 9FT ADLT (ELECTROSURGICAL) ×1 IMPLANT
GAUZE VASELINE 1X8 (GAUZE/BANDAGES/DRESSINGS) ×3 IMPLANT
GLOVE BIO SURGEON STRL SZ 6.5 (GLOVE) ×1 IMPLANT
GLOVE BIO SURGEONS STRL SZ 6.5 (GLOVE) ×1
GLOVE BIOGEL PI IND STRL 6.5 (GLOVE) IMPLANT
GLOVE BIOGEL PI IND STRL 7.5 (GLOVE) ×1 IMPLANT
GLOVE BIOGEL PI INDICATOR 6.5 (GLOVE) ×2
GLOVE BIOGEL PI INDICATOR 7.5 (GLOVE) ×4
GLOVE SURG SS PI 7.0 STRL IVOR (GLOVE) ×3 IMPLANT
GOWN STRL REUS W/ TWL XL LVL3 (GOWN DISPOSABLE) ×1 IMPLANT
GOWN STRL REUS W/TWL XL LVL3 (GOWN DISPOSABLE) ×3
KIT ROOM TURNOVER WOR (KITS) ×3 IMPLANT
MANIFOLD NEPTUNE II (INSTRUMENTS) IMPLANT
NEEDLE HYPO 25X1 1.5 SAFETY (NEEDLE) IMPLANT
NS IRRIG 500ML POUR BTL (IV SOLUTION) IMPLANT
PACK BASIN DAY SURGERY FS (CUSTOM PROCEDURE TRAY) ×3 IMPLANT
PENCIL BUTTON HOLSTER BLD 10FT (ELECTRODE) ×3 IMPLANT
SUT CHROMIC 3 0 SH 27 (SUTURE) ×6 IMPLANT
SYR CONTROL 10ML LL (SYRINGE) IMPLANT
TOWEL OR 17X26 10 PK STRL BLUE (TOWEL DISPOSABLE) ×3 IMPLANT
TRAY DSU PREP LF (CUSTOM PROCEDURE TRAY) ×3 IMPLANT
TUBE CONNECTING 12'X1/4 (SUCTIONS)
TUBE CONNECTING 12X1/4 (SUCTIONS) IMPLANT
WATER STERILE IRR 500ML POUR (IV SOLUTION) IMPLANT

## 2016-06-09 NOTE — H&P (Signed)
CC: BPH New Patient  HPI: Samuel Cooke is a 55 year-old male patient who was referred by Dr. Rachell Cipro, MD who is here today as a new patient for evaluation of BPH and lower tract symptoms.  The patient states if he were to spend the rest of his life with his current urinary condition, he would be pleased.     CC: I am here to discuss a circumcision.  HPI: He has had inflammation of the head of his penis. He has not had to use creams on the head of his penis. He has had difficulties retracting his foreskin.   He has had cracking of his penile skin. He does have bleeding of his penile skin.   He would like to be circumcised.     AUA Symptom Score: He never has the sensation of not emptying his bladder completely after finishing urinating. He never has to urinate again less that two hours after he has finished urinating. He does not have to stop and start again several times when he urinates. He never finds it difficult to postpone urination. He never has a weak urinary stream. He never has to push or strain to begin urination. He has to get up to urinate 1 time from the time he goes to bed until the time he gets up in the morning.   Calculated AUA Symptom Score: 1    ALLERGIES: None   MEDICATIONS: Lisinopril 40 mg tablet  Omeprazole 40 mg capsule,delayed release  Invokamet 50 mg-1,000 mg tablet  Lantus 100 unit/ml vial     GU PSH: None   NON-GU PSH: Hand/finger Surgery Knee Arthroscopy/surgery, Right - 1992    GU PMH: None   NON-GU PMH: Anxiety Arthritis Depression Diabetes Type 2 Encounter for general adult medical examination without abnormal findings, Encounter for preventive health examination GERD Hypercholesterolemia Hypertension    FAMILY HISTORY: 3 daughters - Daughter father deceased at age 60 - Father   SOCIAL HISTORY: Marital Status: Single Does not drink anymore.  Drinks 2 caffeinated drinks per day. Patient's occupation is/was disable.     REVIEW OF SYSTEMS:    GU Review Male:   Patient reports get up at night to urinate. Patient denies frequent urination, hard to postpone urination, burning/ pain with urination, leakage of urine, stream starts and stops, trouble starting your stream, have to strain to urinate , erection problems, and penile pain.  Gastrointestinal (Upper):   Patient denies nausea, vomiting, and indigestion/ heartburn.  Gastrointestinal (Lower):   Patient denies diarrhea and constipation.  Constitutional:   Patient denies fever, night sweats, weight loss, and fatigue.  Skin:   Patient denies skin rash/ lesion and itching.  Eyes:   Patient denies blurred vision and double vision.  Ears/ Nose/ Throat:   Patient denies sore throat and sinus problems.  Hematologic/Lymphatic:   Patient denies swollen glands and easy bruising.  Cardiovascular:   Patient denies leg swelling and chest pains.  Respiratory:   Patient denies cough and shortness of breath.  Endocrine:   Patient denies excessive thirst.  Musculoskeletal:   Patient reports back pain and joint pain.   Neurological:   Patient denies headaches and dizziness.  Psychologic:   Patient reports depression and anxiety.    VITAL SIGNS:      05/18/2016 03:33 PM  Weight 182 lb / 82.55 kg  Height 66 in / 167.64 cm  BP 114/72 mmHg  Pulse 103 /min  Temperature 98.9 F / 37 C  BMI 29.4 kg/m  GU PHYSICAL EXAMINATION:    Penis: Penis uncircumcised, phimosis. No foreskin warts, no cracks. No dorsal peyronie's plaques, no left corporal peyronie's plaques, no right corporal peyronie's plaques, no scarring, no shaft warts. No balanitis, no meatal stenosis. Attending difficult to retract. Painful when it is retracted. Inflammation of the foreskin and the glans penis.   MULTI-SYSTEM PHYSICAL EXAMINATION:    Constitutional: Well-nourished. No physical deformities. Normally developed. Good grooming.  Neck: Neck symmetrical, not swollen. Normal tracheal position.   Respiratory: No labored breathing, no use of accessory muscles.   Cardiovascular: Normal temperature, normal extremity pulses, no swelling, no varicosities.  Lymphatic: No enlargement of neck, axillae, groin.  Skin: No paleness, no jaundice, no cyanosis. No lesion, no ulcer, no rash.  Neurologic / Psychiatric: Oriented to time, oriented to place, oriented to person. No depression, no anxiety, no agitation.  Gastrointestinal: No mass, no tenderness, no rigidity, non obese abdomen.  Eyes: Normal conjunctivae. Normal eyelids.  Ears, Nose, Mouth, and Throat: Left ear no scars, no lesions, no masses. Right ear no scars, no lesions, no masses. Nose no scars, no lesions, no masses. Normal hearing. Normal lips.  Musculoskeletal: Normal gait and station of head and neck.     PAST DATA REVIEWED:  Source Of History:  Patient   PROCEDURES:          Urinalysis - 81003 Dipstick Dipstick Cont'd  Specimen: Voided Bilirubin: Neg  Color: Yellow Ketones: Neg  Appearance: Clear Blood: Neg  Specific Gravity: 1.015 Protein: Neg  pH: 5.0 Urobilinogen: 0.2  Glucose: 2+ Nitrites: Neg    Leukocyte Esterase: Neg    ASSESSMENT:      ICD-10 Details  1 GU:   BPH w/o LUTS - N40.0   3   Phimosis - N47.1   2 NON-GU:   Encounter for routine and ritual male circumcision - Z41.2    PLAN:           Document Letter(s):  Created for Patient: Clinical Summary    The patient and I talked at length about etiologies of phimosis. We talked about the relationship between diabetes, the uncircumcised penis, paraphimosis, and phimosis. We talked about the relationship between phimosis and voiding symptoms including urinary retention, pain and irritative voiding symptoms like urgency, frequency, dysuria and others.   We talked about to various topical creams used to treat phimosis. Circumcision was discussed at length with the patient as a permanent treatment for phimosis. The risks, benefits, and some of the possible  complications of the procedure were discussed with the patient including penile glanular hypersensitivity, distal penile shaft scarring, and others. Alternative treatment options were discussed with the patient in detail. All questions were answered.  The general risks of the operative procedure and the perioperative period were discussed with the patient at length and in detail including swelling, pain, nausea, vomiting, fever, chills, infection, wound infection, sepsis, renal failure, internal or external bleeding, intraoperative bowel, organ or vascular injuries, postoperative formation of scar tissue, the need for blood transfusions, deep venous thrombosis or blood clots, pulmonary embolus, pneumonia, respiratory failure, heart attack, stroke, death and others.   The patient gave fully informed consent to proceed with surgical treatment of his phimosis with circumcision.          Notes:   -schedule circumcision

## 2016-06-09 NOTE — Op Note (Signed)
Date of procedure: 06/09/16  Preoperative diagnosis:  1. Phimosis   Postoperative diagnosis:  1. Phimosis   Procedure: 1. Circumcision  Surgeon: Baruch Gouty, MD  Anesthesia: General  Complications: None  Intraoperative findings: The patient underwent a successful circumcision removal for skin.  EBL: None  Specimens: None  Drains: None  Disposition: Stable to the postanesthesia care unit  Indication for procedure: The patient is a 54 y.o. male with phimosis who is unable to retract his foreskin and presents today for elective circumcision.  After reviewing the management options for treatment, the patient elected to proceed with the above surgical procedure(s). We have discussed the potential benefits and risks of the procedure, side effects of the proposed treatment, the likelihood of the patient achieving the goals of the procedure, and any potential problems that might occur during the procedure or recuperation. Informed consent has been obtained.  Description of procedure: The patient was met in the preoperative area. All risks, benefits, and indications of the procedure were described in great detail. The patient consented to the procedure. Preoperative antibiotics were given. The patient was taken to the operative theater. General anesthesia was induced per the anesthesia service. The patient was then placed in the supine and prepped and draped in the usual sterile fashion. A preoperative timeout was called.   The proximal and distal borders of the patient's foreskin were marked with a marking pen. Care was taken to ensure that the patient was not being over circumcised. Circumcision French incisions were then made around the previously marked lines at the proximal distal on the foreskin. The foreskin was then removed between these lines with electrocautery. Hemostasis was obtained and it was excellent. Interrupted 3-0 chromic sutures are placed at 12:00 and 6:00. Interrupted  3-0 chromic sutures were then placed at 3 and 9:00. In a circumferential fashion, the inner lining ears of the penile skin were reapproximated with interrupted 3-0 chromic sutures. The incision was then wrapped with Vaseline gauze and regular gauze. The patient was awoken from anesthesia and transferred in stable condition to the postanesthesia care unit.  Plan: The patient will follow-up in one month for a wound check.  Baruch Gouty, M.D.

## 2016-06-09 NOTE — Anesthesia Postprocedure Evaluation (Signed)
Anesthesia Post Note  Patient: Samuel Cooke  Procedure(s) Performed: Procedure(s) (LRB): CIRCUMCISION ADULT (N/A)  Patient location during evaluation: PACU Anesthesia Type: General Level of consciousness: sedated Pain management: satisfactory to patient Vital Signs Assessment: post-procedure vital signs reviewed and stable Respiratory status: spontaneous breathing Cardiovascular status: stable Anesthetic complications: no Comments: BS improved Pt told to resume insulin admin at home with his usual guidelines    Last Vitals:  Vitals:   06/09/16 1200 06/09/16 1205  BP: 126/75   Pulse: 83 86  Resp: 19 (!) 21  Temp:      Last Pain:  Vitals:   06/09/16 1005  TempSrc: Oral                 Melat Wrisley EDWARD

## 2016-06-09 NOTE — Anesthesia Preprocedure Evaluation (Signed)
Anesthesia Evaluation  Patient identified by MRN, date of birth, ID band Patient awake    Reviewed: Allergy & Precautions, H&P , Patient's Chart, lab work & pertinent test results, reviewed documented beta blocker date and time   Airway Mallampati: II  TM Distance: >3 FB Neck ROM: full    Dental no notable dental hx.    Pulmonary Current Smoker,    Pulmonary exam normal breath sounds clear to auscultation       Cardiovascular hypertension,  Rhythm:regular Rate:Normal     Neuro/Psych    GI/Hepatic   Endo/Other  diabetes  Renal/GU      Musculoskeletal   Abdominal   Peds  Hematology   Anesthesia Other Findings Hypertension     Type 2 diabetes mellitus ... Uses insulin    History of transient ischemic attack (TIA)      Reproductive/Obstetrics                             Anesthesia Physical Anesthesia Plan  ASA: II  Anesthesia Plan:    Post-op Pain Management:    Induction: Intravenous  Airway Management Planned: LMA  Additional Equipment:   Intra-op Plan:   Post-operative Plan:   Informed Consent: I have reviewed the patients History and Physical, chart, labs and discussed the procedure including the risks, benefits and alternatives for the proposed anesthesia with the patient or authorized representative who has indicated his/her understanding and acceptance.   Dental Advisory Given and Dental advisory given  Plan Discussed with: CRNA and Surgeon  Anesthesia Plan Comments: (Discussed GA with LMA, possible sore throat, potential need to switch to ETT, N/V, pulmonary aspiration. Questions answered. )        Anesthesia Quick Evaluation

## 2016-06-09 NOTE — Anesthesia Procedure Notes (Signed)
Procedure Name: LMA Insertion Date/Time: 06/09/2016 10:42 AM Performed by: Bethena Roys T Pre-anesthesia Checklist: Patient identified, Emergency Drugs available, Suction available and Patient being monitored Patient Re-evaluated:Patient Re-evaluated prior to inductionOxygen Delivery Method: Circle System Utilized Preoxygenation: Pre-oxygenation with 100% oxygen Intubation Type: IV induction LMA: LMA with gastric port inserted LMA Size: 5.0 Number of attempts: 1 Placement Confirmation: positive ETCO2 Tube secured with: Tape Dental Injury: Teeth and Oropharynx as per pre-operative assessment

## 2016-06-09 NOTE — Discharge Instructions (Addendum)
Circumcision-Home Care Instructions  The following instructions have been prepared to help you care for yourself upon your return home today.   Wound Care & Hygiene:   You may apply ice to the penis.  This may help to decrease swelling.  Remove the dressing tomorrow.  If the dressing falls off before then, leave it off.  You may shower or bathe in 48 hours  Gently wash the penis with soap and water.  The stitches do not need to be removed.  Activity:  Do not drive or operate any equipment today.  The effects of anesthesia are still present, drowsiness may result.  Rest today, not necessarily flat bed rest, just take it easy.  You may resume your normal activity in one to two days or as indicated by your physician.  Sexual Activity:  Erection and sexual relations should be avoided for *2 weeks.  Return to Work:  One to two days or as indicated by your physician   Diet:  Drink liquids or eat a very light diet this evening.  You may resume a regular diet tomorrow.  General Expectations of your surgery:   You may have a small amount of bleeding  The penis will be swollen and bruised for approximately one week  You may wake during the night with an erection, usually this is caused by having a full bladder so you should try to urinate (pass your water) to relieve the erection or apply ice to the penis  Unexpected Observations - Call your doctor if these occur!  Persistent or heavy bleeding  Temperature of 101 degrees or more  Severe pain not relieved by medication  Return to Office  Call to schedule your appointment     Post Anesthesia Home Care Instructions  Activity: Get plenty of rest for the remainder of the day. A responsible adult should stay with you for 24 hours following the procedure.  For the next 24 hours, DO NOT: -Drive a car -Paediatric nurse -Drink alcoholic beverages -Take any medication unless instructed by your physician -Make any legal decisions or sign  important papers.  Meals: Start with liquid foods such as gelatin or soup. Progress to regular foods as tolerated. Avoid greasy, spicy, heavy foods. If nausea and/or vomiting occur, drink only clear liquids until the nausea and/or vomiting subsides. Call your physician if vomiting continues.  Special Instructions/Symptoms: Your throat may feel dry or sore from the anesthesia or the breathing tube placed in your throat during surgery. If this causes discomfort, gargle with warm salt water. The discomfort should disappear within 24 hours.  If you had a scopolamine patch placed behind your ear for the management of post- operative nausea and/or vomiting:  1. The medication in the patch is effective for 72 hours, after which it should be removed.  Wrap patch in a tissue and discard in the trash. Wash hands thoroughly with soap and water. 2. You may remove the patch earlier than 72 hours if you experience unpleasant side effects which may include dry mouth, dizziness or visual disturbances. 3. Avoid touching the patch. Wash your hands with soap and water after contact with the patch.

## 2016-06-09 NOTE — Transfer of Care (Signed)
Immediate Anesthesia Transfer of Care Note  Patient: Samuel Cooke  Procedure(s) Performed: Procedure(s): CIRCUMCISION ADULT (N/A)  Patient Location: PACU  Anesthesia Type:General  Level of Consciousness: awake, alert  and oriented  Airway & Oxygen Therapy: Patient Spontanous Breathing and Patient connected to nasal cannula oxygen  Post-op Assessment: Report given to RN  Post vital signs: Reviewed and stable  Last Vitals: 139/84, 92, 20, 97%, 98.3 Vitals:   06/09/16 0942 06/09/16 1005  BP: 125/74 140/80  Pulse: (!) 42 95  Resp: 20 20  Temp: 36.4 C 37.2 C    Last Pain:  Vitals:   06/09/16 1005  TempSrc: Oral      Patients Stated Pain Goal: 7 ([id number])  Complications: No apparent anesthesia complications

## 2016-06-10 ENCOUNTER — Encounter (HOSPITAL_BASED_OUTPATIENT_CLINIC_OR_DEPARTMENT_OTHER): Payer: Self-pay | Admitting: Urology

## 2016-06-18 DIAGNOSIS — L0390 Cellulitis, unspecified: Secondary | ICD-10-CM | POA: Diagnosis not present

## 2016-06-18 DIAGNOSIS — Z6830 Body mass index (BMI) 30.0-30.9, adult: Secondary | ICD-10-CM | POA: Diagnosis not present

## 2016-06-18 DIAGNOSIS — E1165 Type 2 diabetes mellitus with hyperglycemia: Secondary | ICD-10-CM | POA: Diagnosis not present

## 2016-06-18 DIAGNOSIS — I10 Essential (primary) hypertension: Secondary | ICD-10-CM | POA: Diagnosis not present

## 2016-06-24 DIAGNOSIS — L0390 Cellulitis, unspecified: Secondary | ICD-10-CM | POA: Diagnosis not present

## 2016-06-24 DIAGNOSIS — N4889 Other specified disorders of penis: Secondary | ICD-10-CM | POA: Diagnosis not present

## 2016-06-24 DIAGNOSIS — Z6830 Body mass index (BMI) 30.0-30.9, adult: Secondary | ICD-10-CM | POA: Diagnosis not present

## 2016-07-13 DIAGNOSIS — N5201 Erectile dysfunction due to arterial insufficiency: Secondary | ICD-10-CM | POA: Diagnosis not present

## 2016-07-24 DIAGNOSIS — I10 Essential (primary) hypertension: Secondary | ICD-10-CM | POA: Diagnosis not present

## 2016-07-24 DIAGNOSIS — E1165 Type 2 diabetes mellitus with hyperglycemia: Secondary | ICD-10-CM | POA: Diagnosis not present

## 2016-07-24 DIAGNOSIS — Z23 Encounter for immunization: Secondary | ICD-10-CM | POA: Diagnosis not present

## 2016-07-24 DIAGNOSIS — Z720 Tobacco use: Secondary | ICD-10-CM | POA: Diagnosis not present

## 2016-09-07 DIAGNOSIS — Z6830 Body mass index (BMI) 30.0-30.9, adult: Secondary | ICD-10-CM | POA: Diagnosis not present

## 2016-09-07 DIAGNOSIS — L02212 Cutaneous abscess of back [any part, except buttock]: Secondary | ICD-10-CM | POA: Diagnosis not present

## 2016-09-10 DIAGNOSIS — L02212 Cutaneous abscess of back [any part, except buttock]: Secondary | ICD-10-CM | POA: Diagnosis not present

## 2016-09-10 DIAGNOSIS — Z6831 Body mass index (BMI) 31.0-31.9, adult: Secondary | ICD-10-CM | POA: Diagnosis not present

## 2016-09-14 DIAGNOSIS — Z6831 Body mass index (BMI) 31.0-31.9, adult: Secondary | ICD-10-CM | POA: Diagnosis not present

## 2016-09-14 DIAGNOSIS — L02212 Cutaneous abscess of back [any part, except buttock]: Secondary | ICD-10-CM | POA: Diagnosis not present

## 2016-09-28 DIAGNOSIS — E1165 Type 2 diabetes mellitus with hyperglycemia: Secondary | ICD-10-CM | POA: Diagnosis not present

## 2016-09-28 DIAGNOSIS — L723 Sebaceous cyst: Secondary | ICD-10-CM | POA: Diagnosis not present

## 2016-09-28 DIAGNOSIS — L02212 Cutaneous abscess of back [any part, except buttock]: Secondary | ICD-10-CM | POA: Diagnosis not present

## 2016-09-28 DIAGNOSIS — Z6832 Body mass index (BMI) 32.0-32.9, adult: Secondary | ICD-10-CM | POA: Diagnosis not present

## 2016-10-12 DIAGNOSIS — N400 Enlarged prostate without lower urinary tract symptoms: Secondary | ICD-10-CM | POA: Diagnosis not present

## 2016-10-12 DIAGNOSIS — N5201 Erectile dysfunction due to arterial insufficiency: Secondary | ICD-10-CM | POA: Diagnosis not present

## 2016-10-27 DIAGNOSIS — E785 Hyperlipidemia, unspecified: Secondary | ICD-10-CM | POA: Diagnosis not present

## 2016-10-27 DIAGNOSIS — E1165 Type 2 diabetes mellitus with hyperglycemia: Secondary | ICD-10-CM | POA: Diagnosis not present

## 2016-10-27 DIAGNOSIS — I10 Essential (primary) hypertension: Secondary | ICD-10-CM | POA: Diagnosis not present

## 2016-11-20 DIAGNOSIS — E1165 Type 2 diabetes mellitus with hyperglycemia: Secondary | ICD-10-CM | POA: Diagnosis not present

## 2016-11-20 DIAGNOSIS — Z6833 Body mass index (BMI) 33.0-33.9, adult: Secondary | ICD-10-CM | POA: Diagnosis not present

## 2016-11-20 DIAGNOSIS — E785 Hyperlipidemia, unspecified: Secondary | ICD-10-CM | POA: Diagnosis not present

## 2016-11-20 DIAGNOSIS — I10 Essential (primary) hypertension: Secondary | ICD-10-CM | POA: Diagnosis not present

## 2017-01-12 DIAGNOSIS — M7061 Trochanteric bursitis, right hip: Secondary | ICD-10-CM | POA: Diagnosis not present

## 2017-01-12 DIAGNOSIS — E1165 Type 2 diabetes mellitus with hyperglycemia: Secondary | ICD-10-CM | POA: Diagnosis not present

## 2017-01-12 DIAGNOSIS — I10 Essential (primary) hypertension: Secondary | ICD-10-CM | POA: Diagnosis not present

## 2017-01-12 DIAGNOSIS — Z6830 Body mass index (BMI) 30.0-30.9, adult: Secondary | ICD-10-CM | POA: Diagnosis not present

## 2017-02-16 DIAGNOSIS — E1165 Type 2 diabetes mellitus with hyperglycemia: Secondary | ICD-10-CM | POA: Diagnosis not present

## 2017-02-22 DIAGNOSIS — E1165 Type 2 diabetes mellitus with hyperglycemia: Secondary | ICD-10-CM | POA: Diagnosis not present

## 2017-02-22 DIAGNOSIS — Z6831 Body mass index (BMI) 31.0-31.9, adult: Secondary | ICD-10-CM | POA: Diagnosis not present

## 2017-02-22 DIAGNOSIS — I10 Essential (primary) hypertension: Secondary | ICD-10-CM | POA: Diagnosis not present

## 2017-03-22 ENCOUNTER — Other Ambulatory Visit: Payer: Self-pay | Admitting: Family Medicine

## 2017-03-22 DIAGNOSIS — R109 Unspecified abdominal pain: Secondary | ICD-10-CM

## 2017-03-22 DIAGNOSIS — Z6830 Body mass index (BMI) 30.0-30.9, adult: Secondary | ICD-10-CM | POA: Diagnosis not present

## 2017-03-22 DIAGNOSIS — E1165 Type 2 diabetes mellitus with hyperglycemia: Secondary | ICD-10-CM | POA: Diagnosis not present

## 2017-03-22 DIAGNOSIS — I10 Essential (primary) hypertension: Secondary | ICD-10-CM | POA: Diagnosis not present

## 2017-03-22 DIAGNOSIS — K219 Gastro-esophageal reflux disease without esophagitis: Secondary | ICD-10-CM | POA: Diagnosis not present

## 2017-03-25 DIAGNOSIS — R109 Unspecified abdominal pain: Secondary | ICD-10-CM | POA: Diagnosis not present

## 2017-03-25 DIAGNOSIS — E1165 Type 2 diabetes mellitus with hyperglycemia: Secondary | ICD-10-CM | POA: Diagnosis not present

## 2017-03-26 ENCOUNTER — Other Ambulatory Visit: Payer: Self-pay | Admitting: Family Medicine

## 2017-03-26 ENCOUNTER — Ambulatory Visit
Admission: RE | Admit: 2017-03-26 | Discharge: 2017-03-26 | Disposition: A | Discharge status: Home / Self Care | Payer: Commercial Managed Care - HMO | Source: Ambulatory Visit | Attending: Family Medicine | Admitting: Family Medicine

## 2017-03-26 DIAGNOSIS — R1011 Right upper quadrant pain: Secondary | ICD-10-CM

## 2017-03-26 DIAGNOSIS — R109 Unspecified abdominal pain: Secondary | ICD-10-CM

## 2017-03-26 DIAGNOSIS — R1013 Epigastric pain: Secondary | ICD-10-CM

## 2017-03-26 DIAGNOSIS — K760 Fatty (change of) liver, not elsewhere classified: Secondary | ICD-10-CM | POA: Diagnosis not present

## 2017-04-05 DIAGNOSIS — Z23 Encounter for immunization: Secondary | ICD-10-CM | POA: Diagnosis not present

## 2017-04-05 DIAGNOSIS — Z0000 Encounter for general adult medical examination without abnormal findings: Secondary | ICD-10-CM | POA: Diagnosis not present

## 2017-04-05 DIAGNOSIS — E1165 Type 2 diabetes mellitus with hyperglycemia: Secondary | ICD-10-CM | POA: Diagnosis not present

## 2017-04-05 DIAGNOSIS — I10 Essential (primary) hypertension: Secondary | ICD-10-CM | POA: Diagnosis not present

## 2017-04-05 DIAGNOSIS — K219 Gastro-esophageal reflux disease without esophagitis: Secondary | ICD-10-CM | POA: Diagnosis not present

## 2017-04-05 DIAGNOSIS — R109 Unspecified abdominal pain: Secondary | ICD-10-CM | POA: Diagnosis not present

## 2017-04-06 DIAGNOSIS — Z125 Encounter for screening for malignant neoplasm of prostate: Secondary | ICD-10-CM | POA: Diagnosis not present

## 2017-04-13 DIAGNOSIS — F1010 Alcohol abuse, uncomplicated: Secondary | ICD-10-CM | POA: Diagnosis not present

## 2017-04-14 DIAGNOSIS — F1020 Alcohol dependence, uncomplicated: Secondary | ICD-10-CM | POA: Diagnosis not present

## 2017-04-16 DIAGNOSIS — F1020 Alcohol dependence, uncomplicated: Secondary | ICD-10-CM | POA: Diagnosis not present

## 2017-04-19 DIAGNOSIS — F1020 Alcohol dependence, uncomplicated: Secondary | ICD-10-CM | POA: Diagnosis not present

## 2017-04-21 DIAGNOSIS — F1020 Alcohol dependence, uncomplicated: Secondary | ICD-10-CM | POA: Diagnosis not present

## 2017-04-23 DIAGNOSIS — F1020 Alcohol dependence, uncomplicated: Secondary | ICD-10-CM | POA: Diagnosis not present

## 2017-04-24 DIAGNOSIS — F1020 Alcohol dependence, uncomplicated: Secondary | ICD-10-CM | POA: Diagnosis not present

## 2017-04-26 ENCOUNTER — Encounter: Payer: Self-pay | Admitting: Endocrinology

## 2017-04-26 ENCOUNTER — Ambulatory Visit (INDEPENDENT_AMBULATORY_CARE_PROVIDER_SITE_OTHER): Payer: Commercial Managed Care - HMO | Admitting: Endocrinology

## 2017-04-26 VITALS — BP 106/74 | HR 115 | Ht 66.0 in | Wt 200.0 lb

## 2017-04-26 DIAGNOSIS — E8881 Metabolic syndrome: Secondary | ICD-10-CM

## 2017-04-26 DIAGNOSIS — E7800 Pure hypercholesterolemia, unspecified: Secondary | ICD-10-CM

## 2017-04-26 DIAGNOSIS — E1165 Type 2 diabetes mellitus with hyperglycemia: Secondary | ICD-10-CM

## 2017-04-26 DIAGNOSIS — F1020 Alcohol dependence, uncomplicated: Secondary | ICD-10-CM | POA: Diagnosis not present

## 2017-04-26 DIAGNOSIS — I10 Essential (primary) hypertension: Secondary | ICD-10-CM

## 2017-04-26 MED ORDER — PIOGLITAZONE HCL 15 MG PO TABS: 15.0000 mg | ORAL_TABLET | Freq: Every day | ORAL | 2 refills | Status: DC

## 2017-04-26 MED ORDER — SEMAGLUTIDE(0.25 OR 0.5MG/DOS) 2 MG/1.5ML ~~LOC~~ SOPN: 0.5000 mg | PEN_INJECTOR | SUBCUTANEOUS | 2 refills | Status: DC

## 2017-04-26 NOTE — Patient Instructions (Addendum)
Check blood sugars on waking up  3/7 days  Also check blood sugars about 2-3 hours after a meal and do this after different meals by rotation  Recommended blood sugar levels on waking up is 90-130 and about 2 hours after meal is 130-160  Please bring your blood sugar monitor to each visit, thank you  lantus 80 units at dinner  Stop Januvia  Ozempic .25 for 2 shots then 0.5 weekly

## 2017-04-26 NOTE — Progress Notes (Signed)
Patient ID: Samuel Cooke, male   DOB: Dec 11, 1960, 56 y.o.   MRN: [id number]           Reason for Appointment: Consultation for Type 2 Diabetes  Referring physician: Rachell Cipro   History of Present Illness:          Date of diagnosis of type 2 diabetes mellitus: 2008        Although he thinks he had significantly high blood sugars at the time of diagnosis he apparently did not take any medication until a couple of years ago because of financial problems He was recommended glipizide and metformin during his hospitalization in 2012 but did not take this Appears that he has had persistently poor blood sugars He has been on Invokamet for several months along with Januvia  Background history:   Recent history:   INSULIN regimen is: Lantus 75 units in the morning  His most recent A1c done by PCP is 12.5 as of 02/16/17 and no previous records available  Non-insulin hypoglycemic drugs the patient is taking are:Invokamet 50/1000 twice a day, Januvia 100 mg daily  Current management, blood sugar patterns and problems identified:  He thinks his blood sugars were around 300 when he was started on Lantus several months ago  He thinks he has been on the same dose for at least a couple of months  With starting insulin he believes his blood sugars are down only about an average of 50 mg with taking 74 units daily  He has never been on mealtime insulin on medications like Victoza.  His blood sugars are relatively high fasting round lunchtime and somewhat lower suppertime  Does not have any readings after supper but he does not think these are higher        Side effects from medications have been:none  Compliance with the medical regimen: Fair Hypoglycemia:none    Glucose monitoring:  done  times a day         Glucometer: Accu-Chek     Blood Glucose readings by review of home record   PREMEAL Breakfast Lunch Dinner Bedtime  Overall   Glucose range: 168-325 143-338 147-298     Median:        Self-care: The diet that the patient has been following is: tries to limit Drinks with sugar .     Meal times are:  Breakfast is at Lunch: Dinner:    Typical meal intake: Breakfast is eggs and toast, sandwich usually at lunch, dinner is rice and beans.  Snacks usually with regular ice cream at night, not daily               Dietician visit, most recent: never               Exercise:  walking up to 6 miles, 3-4/7 days  Weight history:  Wt Readings from Last 3 Encounters:  04/26/17 200 lb (90.7 kg)  06/09/16 190 lb 5 oz (86.3 kg)  06/02/16 192 lb (87.1 kg)    Glycemic control:   Lab Results  Component Value Date   HGBA1C (H) 12/13/2010    6.5 (NOTE)  According to the ADA Clinical Practice Recommendations for 2011, when HbA1c is used as a screening test:   >=6.5%   Diagnostic of Diabetes Mellitus           (if abnormal result  is confirmed)  5.7-6.4%   Increased risk of developing Diabetes Mellitus  References:Diagnosis and Classification of Diabetes Mellitus,Diabetes TMLY,6503,54(SFKCL 1):S62-S69 and Standards of Medical Care in         Diabetes - 2011,Diabetes EXNT,7001,74  (Suppl 1):S11-S61.   HGBA1C 7.3 12/31/2009   HGBA1C 10.4 09/07/2008   Lab Results  Component Value Date   MICROALBUR 14.60 (H) 09/11/2008   LDLCALC (H) 12/14/2010    118        Total Cholesterol/HDL:CHD Risk Coronary Heart Disease Risk Table                     Men   Women  1/2 Average Risk   3.4   3.3  Average Risk       5.0   4.4  2 X Average Risk   9.6   7.1  3 X Average Risk  23.4   11.0        Use the calculated Patient Ratio above and the CHD Risk Table to determine the patient's CHD Risk.        ATP III CLASSIFICATION (LDL):  <100     mg/dL   Optimal  100-129  mg/dL   Near or Above                    Optimal  130-159  mg/dL   Borderline  160-189  mg/dL   High  >190     mg/dL   Very High   CREATININE  0.80 06/09/2016   No results found for: MICRALBCREAT  No results found for: FRUCTOSAMINE    Allergies as of 04/26/2017   No Known Allergies     Medication List       Accurate as of 04/26/17  3:56 PM. Always use your most recent med list.          atorvastatin 20 MG tablet Commonly known as:  LIPITOR Take 20 mg by mouth every evening.   cephALEXin 500 MG capsule Commonly known as:  KEFLEX Take 1 capsule (500 mg total) by mouth 3 (three) times daily.   cyclobenzaprine 10 MG tablet Commonly known as:  FLEXERIL   diclofenac 50 MG EC tablet Commonly known as:  VOLTAREN Take 1 tablet (50 mg total) by mouth 3 (three) times daily as needed for moderate pain.   HYDROcodone-acetaminophen 5-325 MG tablet Commonly known as:  NORCO Take 1 tablet by mouth every 4 (four) hours as needed for moderate pain.   insulin glargine 100 UNIT/ML injection Commonly known as:  LANTUS Inject 3 Units into the skin every morning. Pt uses in the morning   INVOKAMET 50-1000 MG Tabs Generic drug:  Canagliflozin-Metformin HCl Take 1 tablet by mouth 2 (two) times daily at 8 am and 10 pm.   JANUVIA 100 MG tablet Generic drug:  sitaGLIPtin   lisinopril 40 MG tablet Commonly known as:  PRINIVIL,ZESTRIL Take 40 mg by mouth every morning.   pioglitazone 15 MG tablet Commonly known as:  ACTOS Take 1 tablet (15 mg total) by mouth daily.   Semaglutide 0.25 or 0.5 MG/DOSE Sopn Commonly known as:  OZEMPIC Inject 0.5 mg into the skin once a week.            Discharge  Care Instructions        Start     Ordered   04/26/17 0000  Semaglutide (OZEMPIC) 0.25 or 0.5 MG/DOSE SOPN  Weekly     04/26/17 1430   04/26/17 0000  pioglitazone (ACTOS) 15 MG tablet  Daily     04/26/17 1430      Allergies: No Known Allergies  Past Medical History:  Diagnosis Date  . Anxiety   . Arthritis    hips and lower back   . DDD (degenerative disc disease), lumbosacral   . Diverticulosis of colon   .  History of transient ischemic attack (TIA)    12-13-2010  . Hyperlipidemia   . Hypertension   . Phimosis   . Type 2 diabetes mellitus ([place])   . Wears glasses     Past Surgical History:  Procedure Laterality Date  . CIRCUMCISION N/A 06/09/2016   Procedure: CIRCUMCISION ADULT;  Surgeon: Nickie Retort, MD;  Location: [facility];  Service: Urology;  Laterality: N/A;  . COLONOSCOPY  06/02/2016  . KNEE ARTHROSCOPY Right 1992   bone spurs  . REPAIR TRAUMATIC AMPUTATION LEFT THUMB  1991    Family History  Problem Relation Age of Onset  . Colon cancer Neg Hx   . Colon polyps Neg Hx   . Esophageal cancer Neg Hx   . Rectal cancer Neg Hx   . Stomach cancer Neg Hx     Social History:  reports that he has been smoking.  He has a 10.00 pack-year smoking history. He has never used smokeless tobacco. He reports that he does not drink alcohol or use drugs.   Review of Systems  Constitutional: Positive for weight gain. Negative for reduced appetite.  HENT: Negative for headaches.   Eyes: Negative for blurred vision.  Respiratory: Negative for shortness of breath.   Cardiovascular: Negative for leg swelling.  Gastrointestinal: Negative for diarrhea and abdominal pain.  Endocrine: Negative for fatigue, polydipsia and light-headedness.  Genitourinary: Positive for frequency and nocturia.       Nocturia twice  Musculoskeletal: Positive for joint pain and back pain.  Skin: Negative for rash.  Neurological: Negative for numbness and tingling.     Lipid history: He has been on lipid-lowering drugs, taking 20 mg Lipitor from PCP but no labs available recently    Lab Results  Component Value Date   CHOL 215 (H) 12/14/2010   HDL 68 12/14/2010   LDLCALC (H) 12/14/2010    118        Total Cholesterol/HDL:CHD Risk Coronary Heart Disease Risk Table                     Men   Women  1/2 Average Risk   3.4   3.3  Average Risk       5.0   4.4  2 X Average Risk   9.6    7.1  3 X Average Risk  23.4   11.0        Use the calculated Patient Ratio above and the CHD Risk Table to determine the patient's CHD Risk.        ATP III CLASSIFICATION (LDL):  <100     mg/dL   Optimal  100-129  mg/dL   Near or Above                    Optimal  130-159  mg/dL   Borderline  160-189  mg/dL   High  >190  mg/dL   Very High   TRIG 143 12/14/2010   CHOLHDL 3.2 12/14/2010            Hypertension: Has been treated with lisinopril 40 mg  Most recent eye exam was 2017  Most recent foot exam:04/2017      Physical Examination:  BP 106/74   Pulse (!) 115   Ht 5\' 6"  (1.676 m)   Wt 200 lb (90.7 kg)   SpO2 96%   BMI 32.28 kg/m   GENERAL:         Patient has significant abdominal obesity.   HEENT:         Eye exam shows normal external appearance. Fundus exam shows no obvious retinopathy, difficult to focus on the retina . Oral exam shows normal mucosa .  NECK:  acanthosis present  There is no lymphadenopathy Thyroid is not enlarged and no nodules felt.   Carotids are normal to palpation and no bruit heard LUNGS:         Chest is symmetrical. Lungs are clear to auscultation.Marland Kitchen   HEART:         Heart sounds:  S1 and S2 are normal. No murmur or click heard., no S3 or S4.   ABDOMEN:   There is no distention present. Liver and spleen are not palpable. No other mass or tenderness present.   NEUROLOGICAL:   Ankle jerks are absent bilaterally.    Diabetic Foot Exam - Simple   Simple Foot Form Diabetic Foot exam was performed with the following findings:  Yes   Visual Inspection No deformities, no ulcerations, no other skin breakdown bilaterally:  Yes Sensation Testing Intact to touch and monofilament testing bilaterally:  Yes Pulse Check Posterior Tibialis and Dorsalis pulse intact bilaterally:  Yes Comments Decreased sensation right fifth toe           MUSCULOSKELETAL:  There is no swelling or deformity of the peripheral joints.  Spine is normal to  inspection.   EXTREMITIES:     There is no edema. No skin lesions present.Marland Kitchen SKIN:       No rash or lesions of concern.        ASSESSMENT:  Diabetes type 2, uncontrolled with last A1c 12.5    Although he has been on relatively high doses of insulin with Lantus he has marked hyperglycemia Not clear if he is having post prandial hyperglycemia also since he does not check blood sugars after meals much He has had only minimal benefit from taking Januvia and Invokamet, currently not on maximum dose of Invokana  He has significant obesity especially abdominal along with acanthosis indicating insulin resistance Also not clear if Lantus is effective for the full 24 hours as his blood sugars are better at dinnertime compared to fasting He is variably active He does need to lose weight which he has not been able to achieve Diet can be somewhat better also and he needs more diabetes education including meal planning  Complications of diabetes:none evident but need to review reports of last eye exam, urine microalbumin and also needs follow-up eye exam this year  HYPERLIPIDEMIA: Treated by PCP with no recent reports available   PLAN:     Trial of OZEMPIC instead of Januvia Discussed with the patient the nature of GLP-1 drugs, the action on various organ systems, how they benefit blood glucose control, as well as the benefit of weight loss and  increase satiety . Explained possible side effects, particularly nausea and vomiting that usually  resolve over time; discussed safety information in package insert. Demonstrated the medication injection device and injection technique to the patient.  To start with 0.25 mg dosage weekly for the first 2-4 weeks, he can increase the dose to 0.5 mg if he has no nausea after the second injection  Change Lantus to the evening and increase the dose to 80 units  Trial of ACTOS 15 mg daily for insulin sensitivity, explained how this works and possible side effect  edema and weight gain with higher doses, may consider increasing the dose if effective  Start checking blood sugars after evening meals more consistently and bring monitor for download  Consider adding mealtime insulin if having higher postprandial readings  Also consider increasing dose of Invokana up to 300 mg a day  He will keep appointment with dietitian for meal planning  Follow-up in 4 weeks with fructosamine level  Patient Instructions  Check blood sugars on waking up  3/7 days  Also check blood sugars about 2-3 hours after a meal and do this after different meals by rotation  Recommended blood sugar levels on waking up is 90-130 and about 2 hours after meal is 130-160  Please bring your blood sugar monitor to each visit, thank you  lantus 80 units at dinner  Stop Januvia  Ozempic .25 for 2 shots then 0.5 weekly      Consultation note has been sent to the referring physician  Counseling time on subjects discussed in assessment and plan sections is over 50% of today's 60 minute visit  Samuel Cooke 04/26/2017, 3:56 PM   Note: This office note was prepared with Dragon voice recognition system technology. Any transcriptional errors that result from this process are unintentional.

## 2017-04-28 DIAGNOSIS — F1020 Alcohol dependence, uncomplicated: Secondary | ICD-10-CM | POA: Diagnosis not present

## 2017-04-29 ENCOUNTER — Encounter: Payer: Self-pay | Admitting: Registered"

## 2017-04-29 ENCOUNTER — Encounter: Payer: Commercial Managed Care - HMO | Attending: Family Medicine | Admitting: Registered"

## 2017-04-29 DIAGNOSIS — Z713 Dietary counseling and surveillance: Secondary | ICD-10-CM | POA: Insufficient documentation

## 2017-04-29 DIAGNOSIS — Z6831 Body mass index (BMI) 31.0-31.9, adult: Secondary | ICD-10-CM | POA: Insufficient documentation

## 2017-04-29 DIAGNOSIS — E119 Type 2 diabetes mellitus without complications: Secondary | ICD-10-CM

## 2017-04-29 NOTE — Progress Notes (Signed)
Diabetes Self-Management Education  Visit Type: First/Initial  Appt. Start Time: 1535 Appt. End Time: 1705  04/29/2017  [name], identified by name and date of birth, is a 56 y.o. male with a diagnosis of Diabetes: Type 2.   ASSESSMENT  Height 5\' 6"  (1.676 m), weight 197 lb 8 oz (89.6 kg). Body mass index is 31.88 kg/m.      Diabetes Self-Management Education - 04/29/17 1543      Visit Information   Visit Type First/Initial     Initial Visit   Diabetes Type Type 2   Are you currently following a meal plan? No   Are you taking your medications as prescribed? Yes   Date Diagnosed 10 years ago      Health Coping   How would you rate your overall health? Good     Psychosocial Assessment   Patient Belief/Attitude about Diabetes Other (comment)   Self-care barriers None   Self-management support Doctor's office   Other persons present Patient;Family Member   Patient Concerns --  Pt reports he has no concerns    Special Needs None   Preferred Learning Style No preference indicated   Learning Readiness Ready   How often do you need to have someone help you when you read instructions, pamphlets, or other written materials from your doctor or pharmacy? 1 - Never   What is the last grade level you completed in school? 10th grade     Pre-Education Assessment   Patient understands the diabetes disease and treatment process. Needs Instruction   Patient understands incorporating nutritional management into lifestyle. Needs Instruction   Patient undertands incorporating physical activity into lifestyle. Demonstrates understanding / competency   Patient understands using medications safely. Demonstrates understanding / competency   Patient understands monitoring blood glucose, interpreting and using results Needs Review   Patient understands prevention, detection, and treatment of acute complications. Needs Instruction   Patient understands prevention, detection, and  treatment of chronic complications. Needs Instruction   Patient understands how to develop strategies to address psychosocial issues. Demonstrates understanding / competency   Patient understands how to develop strategies to promote health/change behavior. Needs Instruction     Complications   Last HgB A1C per patient/outside source 12.5 %   How often do you check your blood sugar? 3-4 times/day   Fasting Blood glucose range (mg/dL) >200   Postprandial Blood glucose range (mg/dL) >200   Number of hypoglycemic episodes per month 0   Number of hyperglycemic episodes per week --  Pt reports receving elevated blood sugar readings for almost every reading   Have you had a dilated eye exam in the past 12 months? Yes   Have you had a dental exam in the past 12 months? Yes   Are you checking your feet? No     Dietary Intake   Breakfast bowl of oatmeal with brown sugar and cup of coffee with creamer OR egg sandwich with mayo, mustard, 2 pieces of white bread sometimes orange juice   Snack (morning) None reported.    Lunch Sandwich-ham and cheese OR [place] breast- on two pieces of white   Snack (afternoon) None reported.    Dinner May be rice and beans, baked meat, with some vegetables and water or sugar free soda    Snack (evening) ice cream sometimes-about 4-5 scoops/spoons    Beverage(s) usually water or sugar free soda most of the time; coffee or orange juice in the morning      Exercise  Exercise Type Light (walking / raking leaves)   How many days per week to you exercise? 3   How many minutes per day do you exercise? 150   Total minutes per week of exercise 450     Patient Education   Previous Diabetes Education No   Disease state  Definition of diabetes, type 1 and 2, and the diagnosis of diabetes   Nutrition management  Food label reading, portion sizes and measuring food.;Carbohydrate counting;Role of diet in the treatment of diabetes and the relationship between the three main  macronutrients and blood glucose level;Reviewed blood glucose goals for pre and post meals and how to evaluate the patients' food intake on their blood glucose level.   Physical activity and exercise  Role of exercise on diabetes management, blood pressure control and cardiac health.   Monitoring Interpreting lab values - A1C, lipid, urine microalbumina.;Yearly dilated eye exam;Daily foot exams   Acute complications Taught treatment of hypoglycemia - the 15 rule.   Chronic complications Relationship between chronic complications and blood glucose control;Lipid levels, blood glucose control and heart disease;Retinopathy and reason for yearly dilated eye exams;Reviewed with patient heart disease, higher risk of, and prevention   Psychosocial adjustment Identified and addressed patients feelings and concerns about diabetes     Individualized Goals (developed by patient)   Nutrition General guidelines for healthy choices and portions discussed;Follow meal plan discussed  Have consistent meals and carbohydrate intake    Physical Activity Exercise 3-5 times per week   Medications take my medication as prescribed   Reducing Risk do foot checks daily     Post-Education Assessment   Patient understands the diabetes disease and treatment process. Demonstrates understanding / competency   Patient understands incorporating nutritional management into lifestyle. Demonstrates understanding / competency   Patient undertands incorporating physical activity into lifestyle. Demonstrates understanding / competency   Patient understands using medications safely. Demonstrates understanding / competency   Patient understands monitoring blood glucose, interpreting and using results Demonstrates understanding / competency   Patient understands prevention, detection, and treatment of acute complications. Demonstrates understanding / competency   Patient understands prevention, detection, and treatment of chronic  complications. Demonstrates understanding / competency   Patient understands how to develop strategies to promote health/change behavior. Demonstrates understanding / competency     Outcomes   Expected Outcomes Demonstrated interest in learning. Expect positive outcomes   Program Status Completed      Individualized Plan for Diabetes Self-Management Training:   Learning Objective:  Patient will have a greater understanding of diabetes self-management. Patient education plan is to attend individual and/or group sessions per assessed needs and concerns.   Plan:   There are no Patient Instructions on file for this visit.  Expected Outcomes:  Demonstrated interest in learning. Expect positive outcomes  Education material provided: Living Well with Diabetes, A1C conversion sheet, Meal plan card, My Plate, Snack sheet and Carbohydrate counting sheet  If problems or questions, patient to contact team via:  Phone  Future DSME appointment:  PRN

## 2017-04-30 DIAGNOSIS — F1020 Alcohol dependence, uncomplicated: Secondary | ICD-10-CM | POA: Diagnosis not present

## 2017-05-03 DIAGNOSIS — F1020 Alcohol dependence, uncomplicated: Secondary | ICD-10-CM | POA: Diagnosis not present

## 2017-05-04 DIAGNOSIS — F1020 Alcohol dependence, uncomplicated: Secondary | ICD-10-CM | POA: Diagnosis not present

## 2017-05-05 DIAGNOSIS — F1020 Alcohol dependence, uncomplicated: Secondary | ICD-10-CM | POA: Diagnosis not present

## 2017-05-06 DIAGNOSIS — Z23 Encounter for immunization: Secondary | ICD-10-CM | POA: Diagnosis not present

## 2017-05-06 DIAGNOSIS — K219 Gastro-esophageal reflux disease without esophagitis: Secondary | ICD-10-CM | POA: Diagnosis not present

## 2017-05-06 DIAGNOSIS — E1165 Type 2 diabetes mellitus with hyperglycemia: Secondary | ICD-10-CM | POA: Diagnosis not present

## 2017-05-06 DIAGNOSIS — B9681 Helicobacter pylori [H. pylori] as the cause of diseases classified elsewhere: Secondary | ICD-10-CM | POA: Diagnosis not present

## 2017-05-07 DIAGNOSIS — F1020 Alcohol dependence, uncomplicated: Secondary | ICD-10-CM | POA: Diagnosis not present

## 2017-05-10 DIAGNOSIS — F1020 Alcohol dependence, uncomplicated: Secondary | ICD-10-CM | POA: Diagnosis not present

## 2017-05-10 DIAGNOSIS — Z79899 Other long term (current) drug therapy: Secondary | ICD-10-CM | POA: Diagnosis not present

## 2017-05-11 DIAGNOSIS — F1020 Alcohol dependence, uncomplicated: Secondary | ICD-10-CM | POA: Diagnosis not present

## 2017-05-12 DIAGNOSIS — F1020 Alcohol dependence, uncomplicated: Secondary | ICD-10-CM | POA: Diagnosis not present

## 2017-05-14 DIAGNOSIS — F1020 Alcohol dependence, uncomplicated: Secondary | ICD-10-CM | POA: Diagnosis not present

## 2017-05-17 DIAGNOSIS — Z79899 Other long term (current) drug therapy: Secondary | ICD-10-CM | POA: Diagnosis not present

## 2017-05-17 DIAGNOSIS — F1020 Alcohol dependence, uncomplicated: Secondary | ICD-10-CM | POA: Diagnosis not present

## 2017-05-18 DIAGNOSIS — F1020 Alcohol dependence, uncomplicated: Secondary | ICD-10-CM | POA: Diagnosis not present

## 2017-05-19 DIAGNOSIS — F1020 Alcohol dependence, uncomplicated: Secondary | ICD-10-CM | POA: Diagnosis not present

## 2017-05-21 DIAGNOSIS — F1020 Alcohol dependence, uncomplicated: Secondary | ICD-10-CM | POA: Diagnosis not present

## 2017-05-24 DIAGNOSIS — F1020 Alcohol dependence, uncomplicated: Secondary | ICD-10-CM | POA: Diagnosis not present

## 2017-05-25 DIAGNOSIS — F1020 Alcohol dependence, uncomplicated: Secondary | ICD-10-CM | POA: Diagnosis not present

## 2017-05-26 DIAGNOSIS — F1020 Alcohol dependence, uncomplicated: Secondary | ICD-10-CM | POA: Diagnosis not present

## 2017-05-28 DIAGNOSIS — F1020 Alcohol dependence, uncomplicated: Secondary | ICD-10-CM | POA: Diagnosis not present

## 2017-05-31 DIAGNOSIS — Z23 Encounter for immunization: Secondary | ICD-10-CM | POA: Diagnosis not present

## 2017-06-04 ENCOUNTER — Other Ambulatory Visit (INDEPENDENT_AMBULATORY_CARE_PROVIDER_SITE_OTHER): Payer: Commercial Managed Care - HMO

## 2017-06-04 DIAGNOSIS — E1165 Type 2 diabetes mellitus with hyperglycemia: Secondary | ICD-10-CM

## 2017-06-04 LAB — BASIC METABOLIC PANEL
BUN: 28 mg/dL — ABNORMAL HIGH (ref 6–23)
CALCIUM: 9.7 mg/dL (ref 8.4–10.5)
CO2: 29 mEq/L (ref 19–32)
Chloride: 100 mEq/L (ref 96–112)
Creatinine, Ser: 1.29 mg/dL (ref 0.40–1.50)
GFR: 61.49 mL/min (ref >60.00)
Glucose, Bld: 153 mg/dL — ABNORMAL HIGH (ref 70–99)
POTASSIUM: 4.1 meq/L (ref 3.5–5.1)
SODIUM: 138 meq/L (ref 135–145)

## 2017-06-05 LAB — FRUCTOSAMINE: FRUCTOSAMINE: 323 umol/L — AB (ref 0–285)

## 2017-06-11 ENCOUNTER — Ambulatory Visit (INDEPENDENT_AMBULATORY_CARE_PROVIDER_SITE_OTHER): Payer: Commercial Managed Care - HMO | Admitting: Endocrinology

## 2017-06-11 ENCOUNTER — Encounter: Payer: Self-pay | Admitting: Endocrinology

## 2017-06-11 VITALS — BP 126/74 | HR 114 | Ht 66.0 in | Wt 199.0 lb

## 2017-06-11 DIAGNOSIS — Z794 Long term (current) use of insulin: Secondary | ICD-10-CM

## 2017-06-11 DIAGNOSIS — E1165 Type 2 diabetes mellitus with hyperglycemia: Secondary | ICD-10-CM | POA: Diagnosis not present

## 2017-06-11 NOTE — Progress Notes (Signed)
Patient ID: Samuel Cooke, male   DOB: 10-27-1960, 56 y.o.   MRN: [id number]           Reason for Appointment: follow-up for Type 2 Diabetes  Referring physician: Rachell Cipro   History of Present Illness:          Date of diagnosis of type 2 diabetes mellitus: 2008        Although he thinks he had significantly high blood sugars at the time of diagnosis he apparently did not take any medication until a couple of years ago because of financial problems He was recommended glipizide and metformin during his hospitalization in 2012 but did not take this Appears that he has had persistently poor blood sugars He has been on Invokamet for several months along with Januvia  Background history:   Recent history:   INSULIN regimen is: Lantus 80 units in the pm  His most recent A1c done by PCP is 12.5 as of 02/16/17  His fructosamine is now 323  Non-insulin hypoglycemic drugs the patient is taking are:Invokamet 50/1000 twice a day, Ozempic 0.5 mg weekly   Current management, blood sugar patterns and problems identified:  He was changed from Januvia to [place] on his initial consultation and his Invokana/metformin continued  However his blood sugars although they are improved they are still significantly high especially FASTING  Also his Lantus was changed to the evening and the dose increased up to 80 units instead of 74  He was also told to try and improve his diet with cutting back on sweets and carbohydrates  He has no side effects with Ozempic and may be controlling portions better but has lost only about 2 pounds  His blood sugars are averaging about 190 for the last month   This appears to be an improvement from before when his blood sugars were frequently over 300 at all times  He is fairly consistent with checking his blood sugars at least twice a day although mostly in the afternoons and mornings  He has seen a dietitian about 6 weeks ago, not clear if he has had  adequate education        Side effects from medications have been:none  Compliance with the medical regimen: Fair Hypoglycemia:none    Glucose monitoring:  done  times a day         Glucometer: Accu-Chek     Blood Glucose readings by review of   Mean values apply above for all meters except median for One Touch  PRE-MEAL Fasting Lunch Dinner Bedtime Overall  Glucose range: 136-210 150-187 ? 1 25-214   Mean/median:     190    POST-MEAL PC Breakfast PC Lunch PC Dinner  Glucose range:  1 61-321   Mean/median:         Self-care: The diet that the patient has been following is: tries to limit Drinks with sugar .     Meal times are: Dinner: 5-6    Typical meal intake: Breakfast is eggs and toast, sandwich usually at lunch, dinner is rice and beans.  Snacks usually with sweets,  not daily               Dietician visit, most recent: 9/18               Exercise:  walking up to 6 miles, 3-4/7 days  Weight history:  Wt Readings from Last 3 Encounters:  06/11/17 199 lb (90.3 kg)  04/29/17 197 lb 8 oz (  89.6 kg)  04/26/17 200 lb (90.7 kg)    Glycemic control:   Lab Results  Component Value Date   HGBA1C (H) 12/13/2010    6.5 (NOTE)                                                                       According to the ADA Clinical Practice Recommendations for 2011, when HbA1c is used as a screening test:   >=6.5%   Diagnostic of Diabetes Mellitus           (if abnormal result  is confirmed)  5.7-6.4%   Increased risk of developing Diabetes Mellitus  References:Diagnosis and Classification of Diabetes Mellitus,Diabetes KKXF,8182,99(BZJIR 1):S62-S69 and Standards of Medical Care in         Diabetes - 2011,Diabetes CVEL,3810,17  (Suppl 1):S11-S61.   HGBA1C 7.3 12/31/2009   HGBA1C 10.4 09/07/2008   Lab Results  Component Value Date   MICROALBUR 14.60 (H) 09/11/2008   LDLCALC (H) 12/14/2010    118        Total Cholesterol/HDL:CHD Risk Coronary Heart Disease Risk Table                      Men   Women  1/2 Average Risk   3.4   3.3  Average Risk       5.0   4.4  2 X Average Risk   9.6   7.1  3 X Average Risk  23.4   11.0        Use the calculated Patient Ratio above and the CHD Risk Table to determine the patient's CHD Risk.        ATP III CLASSIFICATION (LDL):  <100     mg/dL   Optimal  100-129  mg/dL   Near or Above                    Optimal  130-159  mg/dL   Borderline  160-189  mg/dL   High  >190     mg/dL   Very High   CREATININE 1.29 06/04/2017   No results found for: MICRALBCREAT  Lab Results  Component Value Date   FRUCTOSAMINE 323 (H) 06/04/2017      Allergies as of 06/11/2017   No Known Allergies     Medication List       Accurate as of 06/11/17  2:00 PM. Always use your most recent med list.          atorvastatin 20 MG tablet Commonly known as:  LIPITOR Take 20 mg by mouth every evening.   cyclobenzaprine 10 MG tablet Commonly known as:  FLEXERIL   DEXILANT 60 MG capsule Generic drug:  dexlansoprazole Take 60 mg by mouth daily.   HYDROcodone-acetaminophen 5-325 MG tablet Commonly known as:  NORCO Take 1 tablet by mouth every 4 (four) hours as needed for moderate pain.   insulin glargine 100 UNIT/ML injection Commonly known as:  LANTUS Inject 84 Units into the skin daily. Pt uses in the evening   INVOKAMET 50-1000 MG Tabs Generic drug:  Canagliflozin-Metformin HCl Take 1 tablet by mouth 2 (two) times daily at 8 am and 10 pm.   lisinopril 40 MG tablet Commonly known as:  PRINIVIL,ZESTRIL Take 40 mg by mouth every morning.   losartan-hydrochlorothiazide 100-25 MG tablet Commonly known as:  HYZAAR Take 1 tablet by mouth daily.   pioglitazone 15 MG tablet Commonly known as:  ACTOS Take 1 tablet (15 mg total) by mouth daily.   Semaglutide 0.25 or 0.5 MG/DOSE Sopn Commonly known as:  OZEMPIC Inject 0.5 mg into the skin once a week.       Allergies: No Known Allergies  Past Medical History:  Diagnosis Date    . Anxiety   . Arthritis    hips and lower back   . DDD (degenerative disc disease), lumbosacral   . Diverticulosis of colon   . History of transient ischemic attack (TIA)    12-13-2010  . Hyperlipidemia   . Hypertension   . Phimosis   . Type 2 diabetes mellitus ([place])   . Wears glasses     Past Surgical History:  Procedure Laterality Date  . CIRCUMCISION N/A 06/09/2016   Procedure: CIRCUMCISION ADULT;  Surgeon: Nickie Retort, MD;  Location: [facility];  Service: Urology;  Laterality: N/A;  . COLONOSCOPY  06/02/2016  . KNEE ARTHROSCOPY Right 1992   bone spurs  . REPAIR TRAUMATIC AMPUTATION LEFT THUMB  1991    Family History  Problem Relation Age of Onset  . Cancer Father   . Colon cancer Neg Hx   . Colon polyps Neg Hx   . Esophageal cancer Neg Hx   . Rectal cancer Neg Hx   . Stomach cancer Neg Hx     Social History:  reports that he has been smoking.  He has a 10.00 pack-year smoking history. He has never used smokeless tobacco. He reports that he does not drink alcohol or use drugs.   Review of Systems   Lipid history: He has been on lipid-lowering drugs, taking 20 mg Lipitor from PCP but no lab reports available recently    Lab Results  Component Value Date   CHOL 215 (H) 12/14/2010   HDL 68 12/14/2010   LDLCALC (H) 12/14/2010    118        Total Cholesterol/HDL:CHD Risk Coronary Heart Disease Risk Table                     Men   Women  1/2 Average Risk   3.4   3.3  Average Risk       5.0   4.4  2 X Average Risk   9.6   7.1  3 X Average Risk  23.4   11.0        Use the calculated Patient Ratio above and the CHD Risk Table to determine the patient's CHD Risk.        ATP III CLASSIFICATION (LDL):  <100     mg/dL   Optimal  100-129  mg/dL   Near or Above                    Optimal  130-159  mg/dL   Borderline  160-189  mg/dL   High  >190     mg/dL   Very High   TRIG 143 12/14/2010   CHOLHDL 3.2 12/14/2010             Hypertension: Has been treated with lisinopril 40 mg  Most recent eye exam was 2017  Most recent foot exam:04/2017      Physical Examination:  BP 126/74   Pulse (!) 114   Ht  5\' 6"  (1.676 m)   Wt 199 lb (90.3 kg)   SpO2 96%   BMI 32.12 kg/m    . ASSESSMENT:  Diabetes type 2, uncontrolled with last A1c 12.5     See history of present illness for detailed discussion of current diabetes management, blood sugar patterns and problems identified  He is now doing better with dding was improved to his basal insulin and Invokamet However his blood sugars are still higher than target especially fasting and they're averaging about 185 He is still insulin resistant despite adding Actos 15 mg Postprandial readings are variable and not as high as before, to be the highest in the afternoon Has been able to keep his weight down with regular exercise and continuing Invokana He does need to be more consistent with diet and does need more diabetes education and meal planning information     PLAN:     Trial of 30 mg Actos  Increase insulin by 10 units to get morning sugars down, given him flowsheet o help adjust this based on fasting blood sugars every 3 days  Since he has a large supply of Lantus will not switch him to effect of insulin as yet   He probably will do better with using [place] or [place] whichever is covered   Showed him how to do a double injection with the Lantus pen for more than 80 units  Stay on Ozempic, may try 1 mg on the next visit  Also may consider mealtime insulin at lunch  Consistent diet  A1c on the next visit  Patient Instructions  lantus 80 +10 at nite  Resttart walking  Better diet less sweets    Counseling time on subjects discussed in assessment and plan sections is over 50% of today's 25 minute visit   Jett Kulzer 06/11/2017, 2:00 PM   Note: This office note was prepared with Estate agent. Any  transcriptional errors that result from this process are unintentional.

## 2017-06-11 NOTE — Patient Instructions (Addendum)
lantus 80 +10 at nite  Resttart walking  Better diet less sweets  Actos 2 tabs   Call before refilling Lantus

## 2017-06-13 ENCOUNTER — Encounter: Payer: Self-pay | Admitting: Endocrinology

## 2017-06-16 ENCOUNTER — Other Ambulatory Visit: Payer: Self-pay

## 2017-06-16 MED ORDER — PIOGLITAZONE HCL 15 MG PO TABS: 15.0000 mg | ORAL_TABLET | Freq: Every day | ORAL | 2 refills | Status: DC

## 2017-07-27 ENCOUNTER — Other Ambulatory Visit: Payer: Self-pay | Admitting: Endocrinology

## 2017-08-04 DIAGNOSIS — I10 Essential (primary) hypertension: Secondary | ICD-10-CM | POA: Diagnosis not present

## 2017-08-04 DIAGNOSIS — E1165 Type 2 diabetes mellitus with hyperglycemia: Secondary | ICD-10-CM | POA: Diagnosis not present

## 2017-08-06 DIAGNOSIS — Z6832 Body mass index (BMI) 32.0-32.9, adult: Secondary | ICD-10-CM | POA: Diagnosis not present

## 2017-08-06 DIAGNOSIS — I10 Essential (primary) hypertension: Secondary | ICD-10-CM | POA: Diagnosis not present

## 2017-08-06 DIAGNOSIS — M19012 Primary osteoarthritis, left shoulder: Secondary | ICD-10-CM | POA: Diagnosis not present

## 2017-08-06 DIAGNOSIS — E1165 Type 2 diabetes mellitus with hyperglycemia: Secondary | ICD-10-CM | POA: Diagnosis not present

## 2017-08-09 ENCOUNTER — Other Ambulatory Visit: Payer: Commercial Managed Care - HMO

## 2017-08-10 NOTE — Progress Notes (Deleted)
Patient ID: Samuel Cooke, male   DOB: 12-Aug-1960, 57 y.o.   MRN: [id number]           Reason for Appointment: follow-up for Type 2 Diabetes  Referring physician: Rachell Cipro   History of Present Illness:          Date of diagnosis of type 2 diabetes mellitus: 2008        Although he thinks he had significantly high blood sugars at the time of diagnosis he apparently did not take any medication until a couple of years ago because of financial problems He was recommended glipizide and metformin during his hospitalization in 2012 but did not take this Appears that he has had persistently poor blood sugars He has been on Invokamet for several months along with Januvia  Background history:   Recent history:   INSULIN regimen is: Lantus 80 units in the pm  His most recent A1c done by PCP is 12.5 as of 02/16/17  His fructosamine is now 323  Non-insulin hypoglycemic drugs the patient is taking are:Invokamet 50/1000 twice a day, Ozempic 0.5 mg weekly   Current management, blood sugar patterns and problems identified:  He was changed from Januvia to [place] on his initial consultation and his Invokana/metformin continued  However his blood sugars although they are improved they are still significantly high especially FASTING  Also his Lantus was changed to the evening and the dose increased up to 80 units instead of 74  He was also told to try and improve his diet with cutting back on sweets and carbohydrates  He has no side effects with Ozempic and may be controlling portions better but has lost only about 2 pounds  His blood sugars are averaging about 190 for the last month   This appears to be an improvement from before when his blood sugars were frequently over 300 at all times  He is fairly consistent with checking his blood sugars at least twice a day although mostly in the afternoons and mornings  He has seen a dietitian about 6 weeks ago, not clear if he has had  adequate education        Side effects from medications have been:none  Compliance with the medical regimen: Fair Hypoglycemia:none    Glucose monitoring:  done  times a day         Glucometer: Accu-Chek     Blood Glucose readings by review of   Mean values apply above for all meters except median for One Touch  PRE-MEAL Fasting Lunch Dinner Bedtime Overall  Glucose range: 136-210 150-187 ? 1 25-214   Mean/median:     190    POST-MEAL PC Breakfast PC Lunch PC Dinner  Glucose range:  1 61-321   Mean/median:         Self-care: The diet that the patient has been following is: tries to limit Drinks with sugar .     Meal times are: Dinner: 5-6    Typical meal intake: Breakfast is eggs and toast, sandwich usually at lunch, dinner is rice and beans.  Snacks usually with sweets,  not daily               Dietician visit, most recent: 9/18               Exercise:  walking up to 6 miles, 3-4/7 days  Weight history:  Wt Readings from Last 3 Encounters:  06/11/17 199 lb (90.3 kg)  04/29/17 197 lb 8 oz (  89.6 kg)  04/26/17 200 lb (90.7 kg)    Glycemic control:   Lab Results  Component Value Date   HGBA1C (H) 12/13/2010    6.5 (NOTE)                                                                       According to the ADA Clinical Practice Recommendations for 2011, when HbA1c is used as a screening test:   >=6.5%   Diagnostic of Diabetes Mellitus           (if abnormal result  is confirmed)  5.7-6.4%   Increased risk of developing Diabetes Mellitus  References:Diagnosis and Classification of Diabetes Mellitus,Diabetes LFYB,0175,10(CHENI 1):S62-S69 and Standards of Medical Care in         Diabetes - 2011,Diabetes DPOE,4235,36  (Suppl 1):S11-S61.   HGBA1C 7.3 12/31/2009   HGBA1C 10.4 09/07/2008   Lab Results  Component Value Date   MICROALBUR 14.60 (H) 09/11/2008   LDLCALC (H) 12/14/2010    118        Total Cholesterol/HDL:CHD Risk Coronary Heart Disease Risk Table                      Men   Women  1/2 Average Risk   3.4   3.3  Average Risk       5.0   4.4  2 X Average Risk   9.6   7.1  3 X Average Risk  23.4   11.0        Use the calculated Patient Ratio above and the CHD Risk Table to determine the patient's CHD Risk.        ATP III CLASSIFICATION (LDL):  <100     mg/dL   Optimal  100-129  mg/dL   Near or Above                    Optimal  130-159  mg/dL   Borderline  160-189  mg/dL   High  >190     mg/dL   Very High   CREATININE 1.29 06/04/2017   No results found for: MICRALBCREAT  Lab Results  Component Value Date   FRUCTOSAMINE 323 (H) 06/04/2017      Allergies as of 08/11/2017   No Known Allergies     Medication List        Accurate as of 08/10/17  5:43 PM. Always use your most recent med list.          atorvastatin 20 MG tablet Commonly known as:  LIPITOR Take 20 mg by mouth every evening.   cyclobenzaprine 10 MG tablet Commonly known as:  FLEXERIL   DEXILANT 60 MG capsule Generic drug:  dexlansoprazole Take 60 mg by mouth daily.   HYDROcodone-acetaminophen 5-325 MG tablet Commonly known as:  NORCO Take 1 tablet by mouth every 4 (four) hours as needed for moderate pain.   insulin glargine 100 UNIT/ML injection Commonly known as:  LANTUS Inject 84 Units into the skin daily. Pt uses in the evening   INVOKAMET 50-1000 MG Tabs Generic drug:  Canagliflozin-Metformin HCl Take 1 tablet by mouth 2 (two) times daily at 8 am and 10 pm.   lisinopril 40 MG tablet Commonly known  as:  PRINIVIL,ZESTRIL Take 40 mg by mouth every morning.   losartan-hydrochlorothiazide 100-25 MG tablet Commonly known as:  HYZAAR Take 1 tablet by mouth daily.   OZEMPIC 0.25 or 0.5 MG/DOSE Sopn Generic drug:  Semaglutide INJECT 0.5 MG INTO THE SKIN ONCE A WEEK.   pioglitazone 15 MG tablet Commonly known as:  ACTOS Take 1 tablet (15 mg total) daily by mouth.       Allergies: No Known Allergies  Past Medical History:  Diagnosis Date  .  Anxiety   . Arthritis    hips and lower back   . DDD (degenerative disc disease), lumbosacral   . Diverticulosis of colon   . History of transient ischemic attack (TIA)    12-13-2010  . Hyperlipidemia   . Hypertension   . Phimosis   . Type 2 diabetes mellitus ([place])   . Wears glasses     Past Surgical History:  Procedure Laterality Date  . CIRCUMCISION N/A 06/09/2016   Procedure: CIRCUMCISION ADULT;  Surgeon: Nickie Retort, MD;  Location: [facility];  Service: Urology;  Laterality: N/A;  . COLONOSCOPY  06/02/2016  . KNEE ARTHROSCOPY Right 1992   bone spurs  . REPAIR TRAUMATIC AMPUTATION LEFT THUMB  1991    Family History  Problem Relation Age of Onset  . Cancer Father   . Colon cancer Neg Hx   . Colon polyps Neg Hx   . Esophageal cancer Neg Hx   . Rectal cancer Neg Hx   . Stomach cancer Neg Hx     Social History:  reports that he has been smoking.  He has a 10.00 pack-year smoking history. he has never used smokeless tobacco. He reports that he does not drink alcohol or use drugs.   Review of Systems   Lipid history: He has been on lipid-lowering drugs, taking 20 mg Lipitor from PCP but no lab reports available recently    Lab Results  Component Value Date   CHOL 215 (H) 12/14/2010   HDL 68 12/14/2010   LDLCALC (H) 12/14/2010    118        Total Cholesterol/HDL:CHD Risk Coronary Heart Disease Risk Table                     Men   Women  1/2 Average Risk   3.4   3.3  Average Risk       5.0   4.4  2 X Average Risk   9.6   7.1  3 X Average Risk  23.4   11.0        Use the calculated Patient Ratio above and the CHD Risk Table to determine the patient's CHD Risk.        ATP III CLASSIFICATION (LDL):  <100     mg/dL   Optimal  100-129  mg/dL   Near or Above                    Optimal  130-159  mg/dL   Borderline  160-189  mg/dL   High  >190     mg/dL   Very High   TRIG 143 12/14/2010   CHOLHDL 3.2 12/14/2010            Hypertension:  Has been treated with lisinopril 40 mg  Most recent eye exam was 2017  Most recent foot exam:04/2017      Physical Examination:  There were no vitals taken for this visit.   Marland Kitchen  ASSESSMENT:  Diabetes type 2, uncontrolled with last A1c 12.5     See history of present illness for detailed discussion of current diabetes management, blood sugar patterns and problems identified  He is now doing better with dding was improved to his basal insulin and Invokamet However his blood sugars are still higher than target especially fasting and they're averaging about 185 He is still insulin resistant despite adding Actos 15 mg Postprandial readings are variable and not as high as before, to be the highest in the afternoon Has been able to keep his weight down with regular exercise and continuing Invokana He does need to be more consistent with diet and does need more diabetes education and meal planning information     PLAN:     Trial of 30 mg Actos  Increase insulin by 10 units to get morning sugars down, given him flowsheet o help adjust this based on fasting blood sugars every 3 days  Since he has a large supply of Lantus will not switch him to effect of insulin as yet   He probably will do better with using [place] or [place] whichever is covered   Showed him how to do a double injection with the Lantus pen for more than 80 units  Stay on Ozempic, may try 1 mg on the next visit  Also may consider mealtime insulin at lunch  Consistent diet  A1c on the next visit  There are no Patient Instructions on file for this visit.   Counseling time on subjects discussed in assessment and plan sections is over 50% of today's 25 minute visit   Elayne Snare 08/10/2017, 5:43 PM   Note: This office note was prepared with Dragon voice recognition system technology. Any transcriptional errors that result from this process are unintentional.

## 2017-08-11 ENCOUNTER — Ambulatory Visit: Payer: Commercial Managed Care - HMO | Admitting: Endocrinology

## 2017-08-11 DIAGNOSIS — Z0289 Encounter for other administrative examinations: Secondary | ICD-10-CM

## 2017-09-06 DIAGNOSIS — I10 Essential (primary) hypertension: Secondary | ICD-10-CM | POA: Diagnosis not present

## 2017-09-06 DIAGNOSIS — E1165 Type 2 diabetes mellitus with hyperglycemia: Secondary | ICD-10-CM | POA: Diagnosis not present

## 2017-09-06 DIAGNOSIS — Z6832 Body mass index (BMI) 32.0-32.9, adult: Secondary | ICD-10-CM | POA: Diagnosis not present

## 2017-09-06 DIAGNOSIS — E782 Mixed hyperlipidemia: Secondary | ICD-10-CM | POA: Diagnosis not present

## 2017-09-07 DIAGNOSIS — E1165 Type 2 diabetes mellitus with hyperglycemia: Secondary | ICD-10-CM | POA: Diagnosis not present

## 2017-09-07 DIAGNOSIS — Z6831 Body mass index (BMI) 31.0-31.9, adult: Secondary | ICD-10-CM | POA: Diagnosis not present

## 2017-09-07 DIAGNOSIS — E782 Mixed hyperlipidemia: Secondary | ICD-10-CM | POA: Diagnosis not present

## 2017-09-22 DIAGNOSIS — E782 Mixed hyperlipidemia: Secondary | ICD-10-CM | POA: Diagnosis not present

## 2017-09-22 DIAGNOSIS — I10 Essential (primary) hypertension: Secondary | ICD-10-CM | POA: Diagnosis not present

## 2017-09-22 DIAGNOSIS — E1165 Type 2 diabetes mellitus with hyperglycemia: Secondary | ICD-10-CM | POA: Diagnosis not present

## 2017-09-22 DIAGNOSIS — Z6833 Body mass index (BMI) 33.0-33.9, adult: Secondary | ICD-10-CM | POA: Diagnosis not present

## 2017-10-04 DIAGNOSIS — Z23 Encounter for immunization: Secondary | ICD-10-CM | POA: Diagnosis not present

## 2017-11-01 DIAGNOSIS — I10 Essential (primary) hypertension: Secondary | ICD-10-CM | POA: Diagnosis not present

## 2017-11-01 DIAGNOSIS — E1165 Type 2 diabetes mellitus with hyperglycemia: Secondary | ICD-10-CM | POA: Diagnosis not present

## 2017-11-01 DIAGNOSIS — E785 Hyperlipidemia, unspecified: Secondary | ICD-10-CM | POA: Diagnosis not present

## 2017-11-05 DIAGNOSIS — E785 Hyperlipidemia, unspecified: Secondary | ICD-10-CM | POA: Diagnosis not present

## 2017-11-05 DIAGNOSIS — Z6833 Body mass index (BMI) 33.0-33.9, adult: Secondary | ICD-10-CM | POA: Diagnosis not present

## 2017-11-05 DIAGNOSIS — E1165 Type 2 diabetes mellitus with hyperglycemia: Secondary | ICD-10-CM | POA: Diagnosis not present

## 2017-11-05 DIAGNOSIS — I10 Essential (primary) hypertension: Secondary | ICD-10-CM | POA: Diagnosis not present

## 2017-12-15 DIAGNOSIS — M25551 Pain in right hip: Secondary | ICD-10-CM | POA: Diagnosis not present

## 2017-12-17 DIAGNOSIS — M1611 Unilateral primary osteoarthritis, right hip: Secondary | ICD-10-CM | POA: Diagnosis not present

## 2017-12-17 DIAGNOSIS — M19071 Primary osteoarthritis, right ankle and foot: Secondary | ICD-10-CM | POA: Diagnosis not present

## 2017-12-17 DIAGNOSIS — E1165 Type 2 diabetes mellitus with hyperglycemia: Secondary | ICD-10-CM | POA: Diagnosis not present

## 2017-12-17 DIAGNOSIS — Z6833 Body mass index (BMI) 33.0-33.9, adult: Secondary | ICD-10-CM | POA: Diagnosis not present

## 2017-12-22 DIAGNOSIS — M1611 Unilateral primary osteoarthritis, right hip: Secondary | ICD-10-CM | POA: Diagnosis not present

## 2018-01-25 DIAGNOSIS — N289 Disorder of kidney and ureter, unspecified: Secondary | ICD-10-CM | POA: Diagnosis not present

## 2018-01-25 DIAGNOSIS — E1165 Type 2 diabetes mellitus with hyperglycemia: Secondary | ICD-10-CM | POA: Diagnosis not present

## 2018-01-26 DIAGNOSIS — M1611 Unilateral primary osteoarthritis, right hip: Secondary | ICD-10-CM | POA: Diagnosis not present

## 2018-01-26 DIAGNOSIS — M25551 Pain in right hip: Secondary | ICD-10-CM | POA: Diagnosis not present

## 2018-01-28 DIAGNOSIS — M79671 Pain in right foot: Secondary | ICD-10-CM | POA: Diagnosis not present

## 2018-01-28 DIAGNOSIS — M549 Dorsalgia, unspecified: Secondary | ICD-10-CM | POA: Diagnosis not present

## 2018-01-28 DIAGNOSIS — M1990 Unspecified osteoarthritis, unspecified site: Secondary | ICD-10-CM | POA: Diagnosis not present

## 2018-01-28 DIAGNOSIS — Z6833 Body mass index (BMI) 33.0-33.9, adult: Secondary | ICD-10-CM | POA: Diagnosis not present

## 2018-01-28 DIAGNOSIS — E1165 Type 2 diabetes mellitus with hyperglycemia: Secondary | ICD-10-CM | POA: Diagnosis not present

## 2018-03-16 DIAGNOSIS — E113291 Type 2 diabetes mellitus with mild nonproliferative diabetic retinopathy without macular edema, right eye: Secondary | ICD-10-CM | POA: Diagnosis not present

## 2018-03-16 DIAGNOSIS — E113212 Type 2 diabetes mellitus with mild nonproliferative diabetic retinopathy with macular edema, left eye: Secondary | ICD-10-CM | POA: Diagnosis not present

## 2018-03-16 DIAGNOSIS — H2513 Age-related nuclear cataract, bilateral: Secondary | ICD-10-CM | POA: Diagnosis not present

## 2018-03-16 DIAGNOSIS — H524 Presbyopia: Secondary | ICD-10-CM | POA: Diagnosis not present

## 2018-03-17 DIAGNOSIS — E113212 Type 2 diabetes mellitus with mild nonproliferative diabetic retinopathy with macular edema, left eye: Secondary | ICD-10-CM | POA: Diagnosis not present

## 2018-04-14 DIAGNOSIS — Z0000 Encounter for general adult medical examination without abnormal findings: Secondary | ICD-10-CM | POA: Diagnosis not present

## 2018-04-14 DIAGNOSIS — Z6833 Body mass index (BMI) 33.0-33.9, adult: Secondary | ICD-10-CM | POA: Diagnosis not present

## 2018-04-14 DIAGNOSIS — Z1211 Encounter for screening for malignant neoplasm of colon: Secondary | ICD-10-CM | POA: Diagnosis not present

## 2018-04-27 DIAGNOSIS — E1165 Type 2 diabetes mellitus with hyperglycemia: Secondary | ICD-10-CM | POA: Diagnosis not present

## 2018-04-27 DIAGNOSIS — Z125 Encounter for screening for malignant neoplasm of prostate: Secondary | ICD-10-CM | POA: Diagnosis not present

## 2018-04-27 DIAGNOSIS — E785 Hyperlipidemia, unspecified: Secondary | ICD-10-CM | POA: Diagnosis not present

## 2018-04-27 DIAGNOSIS — K219 Gastro-esophageal reflux disease without esophagitis: Secondary | ICD-10-CM | POA: Diagnosis not present

## 2018-04-27 DIAGNOSIS — I10 Essential (primary) hypertension: Secondary | ICD-10-CM | POA: Diagnosis not present

## 2018-04-29 DIAGNOSIS — K219 Gastro-esophageal reflux disease without esophagitis: Secondary | ICD-10-CM | POA: Diagnosis not present

## 2018-04-29 DIAGNOSIS — Z6833 Body mass index (BMI) 33.0-33.9, adult: Secondary | ICD-10-CM | POA: Diagnosis not present

## 2018-04-29 DIAGNOSIS — Z125 Encounter for screening for malignant neoplasm of prostate: Secondary | ICD-10-CM | POA: Diagnosis not present

## 2018-04-29 DIAGNOSIS — I10 Essential (primary) hypertension: Secondary | ICD-10-CM | POA: Diagnosis not present

## 2018-04-29 DIAGNOSIS — E782 Mixed hyperlipidemia: Secondary | ICD-10-CM | POA: Diagnosis not present

## 2018-04-29 DIAGNOSIS — E1165 Type 2 diabetes mellitus with hyperglycemia: Secondary | ICD-10-CM | POA: Diagnosis not present

## 2018-07-25 DIAGNOSIS — E1165 Type 2 diabetes mellitus with hyperglycemia: Secondary | ICD-10-CM | POA: Diagnosis not present

## 2018-07-25 DIAGNOSIS — I10 Essential (primary) hypertension: Secondary | ICD-10-CM | POA: Diagnosis not present

## 2018-07-29 DIAGNOSIS — Z6833 Body mass index (BMI) 33.0-33.9, adult: Secondary | ICD-10-CM | POA: Diagnosis not present

## 2018-07-29 DIAGNOSIS — I10 Essential (primary) hypertension: Secondary | ICD-10-CM | POA: Diagnosis not present

## 2018-07-29 DIAGNOSIS — E1165 Type 2 diabetes mellitus with hyperglycemia: Secondary | ICD-10-CM | POA: Diagnosis not present

## 2018-07-29 DIAGNOSIS — K219 Gastro-esophageal reflux disease without esophagitis: Secondary | ICD-10-CM | POA: Diagnosis not present

## 2018-10-28 DIAGNOSIS — E782 Mixed hyperlipidemia: Secondary | ICD-10-CM | POA: Diagnosis not present

## 2018-10-28 DIAGNOSIS — M1611 Unilateral primary osteoarthritis, right hip: Secondary | ICD-10-CM | POA: Diagnosis not present

## 2018-10-28 DIAGNOSIS — I10 Essential (primary) hypertension: Secondary | ICD-10-CM | POA: Diagnosis not present

## 2018-10-28 DIAGNOSIS — E1165 Type 2 diabetes mellitus with hyperglycemia: Secondary | ICD-10-CM | POA: Diagnosis not present

## 2019-01-10 DIAGNOSIS — M549 Dorsalgia, unspecified: Secondary | ICD-10-CM | POA: Diagnosis not present

## 2019-01-10 DIAGNOSIS — E1165 Type 2 diabetes mellitus with hyperglycemia: Secondary | ICD-10-CM | POA: Diagnosis not present

## 2019-01-10 DIAGNOSIS — I10 Essential (primary) hypertension: Secondary | ICD-10-CM | POA: Diagnosis not present

## 2019-04-12 DIAGNOSIS — E1165 Type 2 diabetes mellitus with hyperglycemia: Secondary | ICD-10-CM | POA: Diagnosis not present

## 2019-04-12 DIAGNOSIS — E785 Hyperlipidemia, unspecified: Secondary | ICD-10-CM | POA: Diagnosis not present

## 2019-04-12 DIAGNOSIS — I10 Essential (primary) hypertension: Secondary | ICD-10-CM | POA: Diagnosis not present

## 2019-04-19 DIAGNOSIS — M25551 Pain in right hip: Secondary | ICD-10-CM | POA: Diagnosis not present

## 2019-05-31 DIAGNOSIS — E782 Mixed hyperlipidemia: Secondary | ICD-10-CM | POA: Diagnosis not present

## 2019-05-31 DIAGNOSIS — E1165 Type 2 diabetes mellitus with hyperglycemia: Secondary | ICD-10-CM | POA: Diagnosis not present

## 2019-05-31 DIAGNOSIS — I10 Essential (primary) hypertension: Secondary | ICD-10-CM | POA: Diagnosis not present

## 2019-08-09 DIAGNOSIS — E782 Mixed hyperlipidemia: Secondary | ICD-10-CM | POA: Diagnosis not present

## 2019-08-09 DIAGNOSIS — E1165 Type 2 diabetes mellitus with hyperglycemia: Secondary | ICD-10-CM | POA: Diagnosis not present

## 2019-08-09 DIAGNOSIS — Z0000 Encounter for general adult medical examination without abnormal findings: Secondary | ICD-10-CM | POA: Diagnosis not present

## 2019-08-09 DIAGNOSIS — K219 Gastro-esophageal reflux disease without esophagitis: Secondary | ICD-10-CM | POA: Diagnosis not present

## 2019-08-09 DIAGNOSIS — I10 Essential (primary) hypertension: Secondary | ICD-10-CM | POA: Diagnosis not present

## 2019-08-09 DIAGNOSIS — Z6833 Body mass index (BMI) 33.0-33.9, adult: Secondary | ICD-10-CM | POA: Diagnosis not present

## 2019-09-01 DIAGNOSIS — E785 Hyperlipidemia, unspecified: Secondary | ICD-10-CM | POA: Diagnosis not present

## 2019-09-01 DIAGNOSIS — I10 Essential (primary) hypertension: Secondary | ICD-10-CM | POA: Diagnosis not present

## 2019-09-01 DIAGNOSIS — E1165 Type 2 diabetes mellitus with hyperglycemia: Secondary | ICD-10-CM | POA: Diagnosis not present

## 2019-09-01 DIAGNOSIS — Z125 Encounter for screening for malignant neoplasm of prostate: Secondary | ICD-10-CM | POA: Diagnosis not present

## 2019-09-05 DIAGNOSIS — Z6833 Body mass index (BMI) 33.0-33.9, adult: Secondary | ICD-10-CM | POA: Diagnosis not present

## 2019-09-05 DIAGNOSIS — E1165 Type 2 diabetes mellitus with hyperglycemia: Secondary | ICD-10-CM | POA: Diagnosis not present

## 2019-09-05 DIAGNOSIS — I10 Essential (primary) hypertension: Secondary | ICD-10-CM | POA: Diagnosis not present

## 2019-09-05 DIAGNOSIS — E782 Mixed hyperlipidemia: Secondary | ICD-10-CM | POA: Diagnosis not present

## 2019-11-27 DIAGNOSIS — E1165 Type 2 diabetes mellitus with hyperglycemia: Secondary | ICD-10-CM | POA: Diagnosis not present

## 2019-11-27 DIAGNOSIS — E785 Hyperlipidemia, unspecified: Secondary | ICD-10-CM | POA: Diagnosis not present

## 2019-11-27 DIAGNOSIS — I10 Essential (primary) hypertension: Secondary | ICD-10-CM | POA: Diagnosis not present

## 2019-12-07 DIAGNOSIS — I10 Essential (primary) hypertension: Secondary | ICD-10-CM | POA: Diagnosis not present

## 2019-12-07 DIAGNOSIS — N289 Disorder of kidney and ureter, unspecified: Secondary | ICD-10-CM | POA: Diagnosis not present

## 2019-12-07 DIAGNOSIS — E782 Mixed hyperlipidemia: Secondary | ICD-10-CM | POA: Diagnosis not present

## 2019-12-07 DIAGNOSIS — E1165 Type 2 diabetes mellitus with hyperglycemia: Secondary | ICD-10-CM | POA: Diagnosis not present

## 2020-02-20 DIAGNOSIS — E669 Obesity, unspecified: Secondary | ICD-10-CM | POA: Diagnosis not present

## 2020-02-20 DIAGNOSIS — E1165 Type 2 diabetes mellitus with hyperglycemia: Secondary | ICD-10-CM | POA: Diagnosis not present

## 2020-02-20 DIAGNOSIS — E782 Mixed hyperlipidemia: Secondary | ICD-10-CM | POA: Diagnosis not present

## 2020-02-21 DIAGNOSIS — M1611 Unilateral primary osteoarthritis, right hip: Secondary | ICD-10-CM | POA: Diagnosis not present

## 2020-02-26 DIAGNOSIS — E1165 Type 2 diabetes mellitus with hyperglycemia: Secondary | ICD-10-CM | POA: Diagnosis not present

## 2020-02-26 DIAGNOSIS — I10 Essential (primary) hypertension: Secondary | ICD-10-CM | POA: Diagnosis not present

## 2020-03-08 DIAGNOSIS — E1165 Type 2 diabetes mellitus with hyperglycemia: Secondary | ICD-10-CM | POA: Diagnosis not present

## 2020-03-08 DIAGNOSIS — E669 Obesity, unspecified: Secondary | ICD-10-CM | POA: Diagnosis not present

## 2020-03-08 DIAGNOSIS — I10 Essential (primary) hypertension: Secondary | ICD-10-CM | POA: Diagnosis not present

## 2020-03-29 DIAGNOSIS — E782 Mixed hyperlipidemia: Secondary | ICD-10-CM | POA: Diagnosis not present

## 2020-03-29 DIAGNOSIS — E1165 Type 2 diabetes mellitus with hyperglycemia: Secondary | ICD-10-CM | POA: Diagnosis not present

## 2020-03-29 DIAGNOSIS — E669 Obesity, unspecified: Secondary | ICD-10-CM | POA: Diagnosis not present

## 2020-05-31 DIAGNOSIS — E782 Mixed hyperlipidemia: Secondary | ICD-10-CM | POA: Diagnosis not present

## 2020-05-31 DIAGNOSIS — E1169 Type 2 diabetes mellitus with other specified complication: Secondary | ICD-10-CM | POA: Diagnosis not present

## 2020-05-31 DIAGNOSIS — I10 Essential (primary) hypertension: Secondary | ICD-10-CM | POA: Diagnosis not present

## 2020-06-06 DIAGNOSIS — M160 Bilateral primary osteoarthritis of hip: Secondary | ICD-10-CM | POA: Diagnosis not present

## 2020-06-06 DIAGNOSIS — E782 Mixed hyperlipidemia: Secondary | ICD-10-CM | POA: Diagnosis not present

## 2020-06-06 DIAGNOSIS — I10 Essential (primary) hypertension: Secondary | ICD-10-CM | POA: Diagnosis not present

## 2020-06-06 DIAGNOSIS — E1165 Type 2 diabetes mellitus with hyperglycemia: Secondary | ICD-10-CM | POA: Diagnosis not present

## 2020-07-08 DIAGNOSIS — I10 Essential (primary) hypertension: Secondary | ICD-10-CM | POA: Diagnosis not present

## 2020-07-08 DIAGNOSIS — E782 Mixed hyperlipidemia: Secondary | ICD-10-CM | POA: Diagnosis not present

## 2020-07-08 DIAGNOSIS — E669 Obesity, unspecified: Secondary | ICD-10-CM | POA: Diagnosis not present

## 2020-07-08 DIAGNOSIS — E1165 Type 2 diabetes mellitus with hyperglycemia: Secondary | ICD-10-CM | POA: Diagnosis not present

## 2020-07-16 DIAGNOSIS — Z1331 Encounter for screening for depression: Secondary | ICD-10-CM | POA: Diagnosis not present

## 2020-07-16 DIAGNOSIS — N529 Male erectile dysfunction, unspecified: Secondary | ICD-10-CM | POA: Diagnosis not present

## 2020-07-16 DIAGNOSIS — Z1339 Encounter for screening examination for other mental health and behavioral disorders: Secondary | ICD-10-CM | POA: Diagnosis not present

## 2020-07-16 DIAGNOSIS — Z0000 Encounter for general adult medical examination without abnormal findings: Secondary | ICD-10-CM | POA: Diagnosis not present

## 2020-07-30 DIAGNOSIS — Z1211 Encounter for screening for malignant neoplasm of colon: Secondary | ICD-10-CM | POA: Diagnosis not present

## 2020-12-06 DIAGNOSIS — E1165 Type 2 diabetes mellitus with hyperglycemia: Secondary | ICD-10-CM | POA: Diagnosis not present

## 2020-12-06 DIAGNOSIS — E782 Mixed hyperlipidemia: Secondary | ICD-10-CM | POA: Diagnosis not present

## 2020-12-11 DIAGNOSIS — E1165 Type 2 diabetes mellitus with hyperglycemia: Secondary | ICD-10-CM | POA: Diagnosis not present

## 2020-12-11 DIAGNOSIS — F331 Major depressive disorder, recurrent, moderate: Secondary | ICD-10-CM | POA: Diagnosis not present

## 2020-12-11 DIAGNOSIS — E782 Mixed hyperlipidemia: Secondary | ICD-10-CM | POA: Diagnosis not present

## 2020-12-11 DIAGNOSIS — F411 Generalized anxiety disorder: Secondary | ICD-10-CM | POA: Diagnosis not present

## 2020-12-13 DIAGNOSIS — Z23 Encounter for immunization: Secondary | ICD-10-CM | POA: Diagnosis not present

## 2020-12-13 DIAGNOSIS — E1165 Type 2 diabetes mellitus with hyperglycemia: Secondary | ICD-10-CM | POA: Diagnosis not present

## 2020-12-13 DIAGNOSIS — Z7185 Encounter for immunization safety counseling: Secondary | ICD-10-CM | POA: Diagnosis not present

## 2020-12-13 DIAGNOSIS — M160 Bilateral primary osteoarthritis of hip: Secondary | ICD-10-CM | POA: Diagnosis not present

## 2020-12-23 ENCOUNTER — Ambulatory Visit
Admission: RE | Admit: 2020-12-23 | Discharge: 2020-12-23 | Disposition: A | Discharge status: Home / Self Care | Payer: Commercial Managed Care - HMO | Source: Ambulatory Visit | Attending: Family Medicine | Admitting: Family Medicine

## 2020-12-23 ENCOUNTER — Other Ambulatory Visit: Payer: Self-pay | Admitting: Family Medicine

## 2020-12-23 ENCOUNTER — Encounter (INDEPENDENT_AMBULATORY_CARE_PROVIDER_SITE_OTHER): Payer: Self-pay

## 2020-12-23 DIAGNOSIS — M160 Bilateral primary osteoarthritis of hip: Secondary | ICD-10-CM

## 2020-12-23 DIAGNOSIS — I708 Atherosclerosis of other arteries: Secondary | ICD-10-CM | POA: Diagnosis not present

## 2020-12-25 DIAGNOSIS — E782 Mixed hyperlipidemia: Secondary | ICD-10-CM | POA: Diagnosis not present

## 2020-12-25 DIAGNOSIS — E1165 Type 2 diabetes mellitus with hyperglycemia: Secondary | ICD-10-CM | POA: Diagnosis not present

## 2020-12-25 DIAGNOSIS — I10 Essential (primary) hypertension: Secondary | ICD-10-CM | POA: Diagnosis not present

## 2021-01-08 DIAGNOSIS — E1165 Type 2 diabetes mellitus with hyperglycemia: Secondary | ICD-10-CM | POA: Diagnosis not present

## 2021-01-09 DIAGNOSIS — I10 Essential (primary) hypertension: Secondary | ICD-10-CM | POA: Diagnosis not present

## 2021-01-09 DIAGNOSIS — E669 Obesity, unspecified: Secondary | ICD-10-CM | POA: Diagnosis not present

## 2021-01-09 DIAGNOSIS — E782 Mixed hyperlipidemia: Secondary | ICD-10-CM | POA: Diagnosis not present

## 2021-01-09 DIAGNOSIS — E1165 Type 2 diabetes mellitus with hyperglycemia: Secondary | ICD-10-CM | POA: Diagnosis not present

## 2021-01-09 DIAGNOSIS — E1169 Type 2 diabetes mellitus with other specified complication: Secondary | ICD-10-CM | POA: Diagnosis not present

## 2021-01-09 DIAGNOSIS — I152 Hypertension secondary to endocrine disorders: Secondary | ICD-10-CM | POA: Diagnosis not present

## 2021-01-22 DIAGNOSIS — E11319 Type 2 diabetes mellitus with unspecified diabetic retinopathy without macular edema: Secondary | ICD-10-CM | POA: Diagnosis not present

## 2021-01-22 DIAGNOSIS — E1165 Type 2 diabetes mellitus with hyperglycemia: Secondary | ICD-10-CM | POA: Diagnosis not present

## 2021-01-22 DIAGNOSIS — F411 Generalized anxiety disorder: Secondary | ICD-10-CM | POA: Diagnosis not present

## 2021-01-22 DIAGNOSIS — F331 Major depressive disorder, recurrent, moderate: Secondary | ICD-10-CM | POA: Diagnosis not present

## 2021-02-06 DIAGNOSIS — H5213 Myopia, bilateral: Secondary | ICD-10-CM | POA: Diagnosis not present

## 2021-02-06 DIAGNOSIS — E119 Type 2 diabetes mellitus without complications: Secondary | ICD-10-CM | POA: Diagnosis not present

## 2021-02-19 DIAGNOSIS — I10 Essential (primary) hypertension: Secondary | ICD-10-CM | POA: Diagnosis not present

## 2021-02-19 DIAGNOSIS — E11319 Type 2 diabetes mellitus with unspecified diabetic retinopathy without macular edema: Secondary | ICD-10-CM | POA: Diagnosis not present

## 2021-02-19 DIAGNOSIS — E782 Mixed hyperlipidemia: Secondary | ICD-10-CM | POA: Diagnosis not present

## 2021-02-19 DIAGNOSIS — E1165 Type 2 diabetes mellitus with hyperglycemia: Secondary | ICD-10-CM | POA: Diagnosis not present

## 2021-02-19 DIAGNOSIS — M545 Low back pain, unspecified: Secondary | ICD-10-CM | POA: Diagnosis not present

## 2021-02-19 DIAGNOSIS — M160 Bilateral primary osteoarthritis of hip: Secondary | ICD-10-CM | POA: Diagnosis not present

## 2021-03-03 DIAGNOSIS — M1611 Unilateral primary osteoarthritis, right hip: Secondary | ICD-10-CM | POA: Diagnosis not present

## 2021-03-03 DIAGNOSIS — M25552 Pain in left hip: Secondary | ICD-10-CM | POA: Diagnosis not present

## 2021-03-24 DIAGNOSIS — E1165 Type 2 diabetes mellitus with hyperglycemia: Secondary | ICD-10-CM | POA: Diagnosis not present

## 2021-03-26 DIAGNOSIS — M160 Bilateral primary osteoarthritis of hip: Secondary | ICD-10-CM | POA: Diagnosis not present

## 2021-03-26 DIAGNOSIS — E1165 Type 2 diabetes mellitus with hyperglycemia: Secondary | ICD-10-CM | POA: Diagnosis not present

## 2021-03-26 DIAGNOSIS — E669 Obesity, unspecified: Secondary | ICD-10-CM | POA: Diagnosis not present

## 2021-03-26 NOTE — Progress Notes (Signed)
DUE TO COVID-19 ONLY ONE VISITOR IS ALLOWED TO COME WITH YOU AND STAY IN THE WAITING ROOM ONLY DURING PRE OP AND PROCEDURE DAY OF SURGERY.  2 VISITOR  MAY VISIT WITH YOU AFTER SURGERY IN YOUR PRIVATE ROOM DURING VISITING HOURS ONLY!  YOU NEED TO HAVE A COVID 19 TEST ON____  04/04/21 __'@_'$  '@_from'$  8am-3pm _____, THIS TEST MUST BE DONE BEFORE SURGERY,  Covid test is done at [place], [place] [address].  This is a drive thru.  No appt required. Please see map.                 Your procedure is scheduled on:  04/08/2021   Report to [facility] Main  Entrance   Report to admitting at   0600am      Call this number if you have problems the morning of surgery [phone]    REMEMBER: NO  SOLID FOOD CANDY OR GUM AFTER MIDNIGHT. CLEAR LIQUIDS UNTIL     0540am       . NOTHING BY MOUTH EXCEPT CLEAR LIQUIDS UNTIL     0540am   . PLEASE FINISH  G2 Lower sugar DRINK PER SURGEON ORDER  WHICH NEEDS TO BE COMPLETED AT   0540am    .      CLEAR LIQUID DIET   Foods Allowed                                                                    Coffee and tea, regular and decaf                            Fruit ices (not with fruit pulp)                                      Iced Popsicles                                    Carbonated beverages, regular and diet                                    Cranberry, grape and apple juices Sports drinks like Gatorade Lightly seasoned clear broth or consume(fat free) Sugar, honey syrup ___________________________________________________________________      BRUSH YOUR TEETH MORNING OF SURGERY AND RINSE YOUR MOUTH OUT, NO CHEWING GUM CANDY OR MINTS.     Take these medicines the morning of surgery with A SIP OF WATER:  dexilant  Take 1/2 dose of Lantus Insulin nite before surgery.     DO NOT TAKE ANY DIABETIC MEDICATIONS DAY OF YOUR SURGERY                               You may not have any metal on your body including hair pins and               piercings  Do not wear jewelry,  make-up, lotions, powders or perfumes, deodorant             Do not wear nail polish on your fingernails.  Do not shave  48 hours prior to surgery.              Men may shave face and neck.   Do not bring valuables to the hospital. [place].  Contacts, dentures or bridgework may not be worn into surgery.  Leave suitcase in the car. After surgery it may be brought to your room.     Patients discharged the day of surgery will not be allowed to drive home. IF YOU ARE HAVING SURGERY AND GOING HOME THE SAME DAY, YOU MUST HAVE AN ADULT TO DRIVE YOU HOME AND BE WITH YOU FOR 24 HOURS. YOU MAY GO HOME BY TAXI OR UBER OR ORTHERWISE, BUT AN ADULT MUST ACCOMPANY YOU HOME AND STAY WITH YOU FOR 24 HOURS.  Name and phone number of your driver:  Special Instructions: N/A              Please read over the following fact sheets you were given: _____________________________________________________________________  [facility] - Preparing for Surgery Before surgery, you can play an important role.  Because skin is not sterile, your skin needs to be as free of germs as possible.  You can reduce the number of germs on your skin by washing with CHG (chlorahexidine gluconate) soap before surgery.  CHG is an antiseptic cleaner which kills germs and bonds with the skin to continue killing germs even after washing. Please DO NOT use if you have an allergy to CHG or antibacterial soaps.  If your skin becomes reddened/irritated stop using the CHG and inform your nurse when you arrive at Short Stay. Do not shave (including legs and underarms) for at least 48 hours prior to the first CHG shower.  You may shave your face/neck. Please follow these instructions carefully:  1.  Shower with CHG Soap the night before surgery and the  morning of Surgery.  2.  If you choose to wash your hair, wash your hair first as usual with your  normal   shampoo.  3.  After you shampoo, rinse your hair and body thoroughly to remove the  shampoo.                           4.  Use CHG as you would any other liquid soap.  You can apply chg directly  to the skin and wash                       Gently with a scrungie or clean washcloth.  5.  Apply the CHG Soap to your body ONLY FROM THE NECK DOWN.   Do not use on face/ open                           Wound or open sores. Avoid contact with eyes, ears mouth and genitals (private parts).                       Wash face,  Genitals (private parts) with your normal soap.             6.  Wash thoroughly, paying special attention  to the area where your surgery  will be performed.  7.  Thoroughly rinse your body with warm water from the neck down.  8.  DO NOT shower/wash with your normal soap after using and rinsing off  the CHG Soap.                9.  Pat yourself dry with a clean towel.            10.  Wear clean pajamas.            11.  Place clean sheets on your bed the night of your first shower and do not  sleep with pets. Day of Surgery : Do not apply any lotions/deodorants the morning of surgery.  Please wear clean clothes to the hospital/surgery center.  FAILURE TO FOLLOW THESE INSTRUCTIONS MAY RESULT IN THE CANCELLATION OF YOUR SURGERY PATIENT SIGNATURE_________________________________  NURSE SIGNATURE__________________________________  ________________________________________________________________________

## 2021-03-28 ENCOUNTER — Encounter (HOSPITAL_COMMUNITY): Payer: Self-pay

## 2021-03-28 ENCOUNTER — Other Ambulatory Visit: Payer: Self-pay

## 2021-03-28 ENCOUNTER — Encounter (HOSPITAL_COMMUNITY)
Admission: RE | Admit: 2021-03-28 | Discharge: 2021-03-28 | Disposition: A | Discharge status: Home / Self Care | Payer: Medicare HMO | Source: Ambulatory Visit | Attending: Orthopedic Surgery | Admitting: Orthopedic Surgery

## 2021-03-28 DIAGNOSIS — I4510 Unspecified right bundle-branch block: Secondary | ICD-10-CM | POA: Diagnosis not present

## 2021-03-28 DIAGNOSIS — Z01818 Encounter for other preprocedural examination: Secondary | ICD-10-CM | POA: Diagnosis not present

## 2021-03-28 HISTORY — DX: Depression, unspecified: F32.A

## 2021-03-28 LAB — GLUCOSE, CAPILLARY: Glucose-Capillary: 181 mg/dL — ABNORMAL HIGH (ref 70–99)

## 2021-03-28 LAB — COMPREHENSIVE METABOLIC PANEL
ALT: 50 U/L — ABNORMAL HIGH (ref 0–44)
AST: 36 U/L (ref 15–41)
Albumin: 4.2 g/dL (ref 3.5–5.0)
Alkaline Phosphatase: 51 U/L (ref 38–126)
Anion gap: 12 (ref 5–15)
BUN: 18 mg/dL (ref 6–20)
CO2: 26 mmol/L (ref 22–32)
Calcium: 10.4 mg/dL — ABNORMAL HIGH (ref 8.9–10.3)
Chloride: 99 mmol/L (ref 98–111)
Creatinine, Ser: 1.02 mg/dL (ref 0.61–1.24)
GFR, Estimated: >60 mL/min (ref >60)
Glucose, Bld: 146 mg/dL — ABNORMAL HIGH (ref 70–99)
Potassium: 4.1 mmol/L (ref 3.5–5.1)
Sodium: 137 mmol/L (ref 135–145)
Total Bilirubin: 0.6 mg/dL (ref 0.3–1.2)
Total Protein: 7.7 g/dL (ref 6.5–8.1)

## 2021-03-28 LAB — CBC
HCT: 49.7 % (ref 39.0–52.0)
Hemoglobin: 16.1 g/dL (ref 13.0–17.0)
MCH: 28.3 pg (ref 26.0–34.0)
MCHC: 32.4 g/dL (ref 30.0–36.0)
MCV: 87.3 fL (ref 80.0–100.0)
Platelets: 233 10*3/uL (ref 150–400)
RBC: 5.69 MIL/uL (ref 4.22–5.81)
RDW: 13.3 % (ref 11.5–15.5)
WBC: 6.1 10*3/uL (ref 4.0–10.5)
nRBC: 0 % (ref 0.0–0.2)

## 2021-03-28 LAB — NO BLOOD PRODUCTS

## 2021-03-28 LAB — SURGICAL PCR SCREEN
MRSA, PCR: NEGATIVE
Staphylococcus aureus: NEGATIVE

## 2021-03-28 NOTE — Progress Notes (Addendum)
Anesthesia Review:  PCP: DR Rachell Cipro [phone] To be seen by MD on 03/31/21 for preop appt  03/31/21 note on chart and clearance  Cardiologist : none  Chest x-ray : EKG : 03/28/2021  Echo : Stress test: Cardiac Cath :  Activity level: can do a flgiht of stairs withoujt difficulty  Sleep Study/ CPAP : none  Fasting Blood Sugar :      / Checks Blood Sugar -- times a day:   Blood Thinner/ Instructions /Last Dose: ASA / Instructions/ Last Dose :   Covid test- 04/04/21  Hgba1c-03/25/21-6.8 on chart  Did not fax on 03/28/21.  Called and LVMM on pom of 03/28/21 and rerquested most recent hgba1c result which pt reports was done first of week and asked for ov note of 03/31/21 for preop.   DM- type 2 - Checks glucose by [employer] Witness - Blood Refusal on front of chart.  Amputated left thumb  Blood Refusal faxed to Blood Bank and to Dr Alvan Dame office.  Confirmations received.  NOted Blood Refusal in FYI  Placed in Allergies  Placed Blood PRoduct Refusal in ORders  LVMM for Orson Slick at [facility] and made aware of Blood Product Refusal.

## 2021-03-31 DIAGNOSIS — E1165 Type 2 diabetes mellitus with hyperglycemia: Secondary | ICD-10-CM | POA: Diagnosis not present

## 2021-03-31 DIAGNOSIS — I10 Essential (primary) hypertension: Secondary | ICD-10-CM | POA: Diagnosis not present

## 2021-03-31 DIAGNOSIS — Z23 Encounter for immunization: Secondary | ICD-10-CM | POA: Diagnosis not present

## 2021-03-31 DIAGNOSIS — E782 Mixed hyperlipidemia: Secondary | ICD-10-CM | POA: Diagnosis not present

## 2021-04-02 ENCOUNTER — Other Ambulatory Visit: Payer: Self-pay | Admitting: Orthopedic Surgery

## 2021-04-03 LAB — SARS CORONAVIRUS 2 (TAT 6-24 HRS): SARS Coronavirus 2: NEGATIVE

## 2021-04-06 NOTE — H&P (Signed)
TOTAL HIP ADMISSION H&P  Patient is admitted for right total hip arthroplasty.  Subjective:  Chief Complaint: right hip pain  HPI: Samuel Cooke, 60 y.o. male, has a history of pain and functional disability in the right hip(s) due to arthritis and patient has failed non-surgical conservative treatments for greater than 12 weeks to include NSAID's and/or analgesics, corticosteriod injections, and activity modification.  Onset of symptoms was gradual starting 5 years ago with gradually worsening course since that time.The patient noted no past surgery on the right hip(s).  Patient currently rates pain in the right hip at 8 out of 10 with activity. Patient has worsening of pain with activity and weight bearing, pain that interfers with activities of daily living, and pain with passive range of motion. Patient has evidence of joint space narrowing by imaging studies. This condition presents safety issues increasing the risk of falls. There is no current active infection.  Patient Active Problem List   Diagnosis Date Noted   DEGENERATIVE JOINT DISEASE, LEFT KNEE 12/31/2009   OSTEOCHONDROMAS, MULTIPLE, LEFT LE 11/23/2009   PAIN IN JOINT, LOW BACK, HIPS AND KNEES 11/12/2009   ALCOHOL ABUSE 09/06/2009   Osteoarthrosis, unspecified whether generalized or localized, pelvic region and thigh 03/31/2009   HYPERLIPIDEMIA, MIXED 10/23/2008   LIVER FUNCTION TESTS, ABNORMAL, HX OF 10/23/2008   DIABETES MELLITUS, TYPE II, UNCONTROLLED 05/03/2008   ESSENTIAL HYPERTENSION 05/03/2008   ANKLE SPRAIN, RIGHT 08/10/2004   Past Medical History:  Diagnosis Date   Anxiety    Arthritis    hips and lower back    DDD (degenerative disc disease), lumbosacral    Depression    Diverticulosis of colon    GERD (gastroesophageal reflux disease)    History of transient ischemic attack (TIA)    12-13-2010   Hyperlipidemia    Hypertension    Phimosis    Type 2 diabetes mellitus ([place])    Wears glasses     Past  Surgical History:  Procedure Laterality Date   CIRCUMCISION N/A 06/09/2016   Procedure: CIRCUMCISION ADULT;  Surgeon: Nickie Retort, MD;  Location: [facility];  Service: Urology;  Laterality: N/A;   COLONOSCOPY  06/02/2016   KNEE ARTHROSCOPY Right 1992   bone spurs   REPAIR TRAUMATIC AMPUTATION LEFT THUMB  1991    No current facility-administered medications for this encounter.   Current Outpatient Medications  Medication Sig Dispense Refill Last Dose   atorvastatin (LIPITOR) 20 MG tablet Take 20 mg by mouth every evening.       Canagliflozin-Metformin HCl (INVOKAMET) 50-1000 MG TABS Take 1 tablet by mouth 2 (two) times daily at 8 am and 10 pm.      DEXILANT 60 MG capsule Take 60 mg by mouth daily.  11    HYDROcodone-acetaminophen (NORCO) 5-325 MG tablet Take 1 tablet by mouth every 4 (four) hours as needed for moderate pain. 30 tablet 0    insulin glargine (LANTUS) 100 UNIT/ML injection Inject 84 Units into the skin daily. Pt uses at 12 noon every day      lisinopril (PRINIVIL,ZESTRIL) 40 MG tablet Take 40 mg by mouth daily. Pt takes in the pm      losartan-hydrochlorothiazide (HYZAAR) 100-25 MG tablet Take 1 tablet by mouth daily. Pt takes in the afternoon  3    OZEMPIC 0.25 or 0.5 MG/DOSE SOPN INJECT 0.5 MG INTO THE SKIN ONCE A WEEK. (Patient taking differently: Tuesday at 530pm) 2 pen 2    Allergies  Allergen Reactions  Other     BLOOD PRODUCT REFUSAL     Social History   Tobacco Use   Smoking status: Former    Packs/day: 0.25    Years: 40.00    Pack years: 10.00    Types: Cigarettes    Quit date: 08/10/2016    Years since quitting: 4.6   Smokeless tobacco: Never  Substance Use Topics   Alcohol use: No    Family History  Problem Relation Age of Onset   Cancer Father    Colon cancer Neg Hx    Colon polyps Neg Hx    Esophageal cancer Neg Hx    Rectal cancer Neg Hx    Stomach cancer Neg Hx      Review of Systems  Constitutional:  Negative for  chills and fever.  Respiratory:  Negative for cough and shortness of breath.   Cardiovascular:  Negative for chest pain.  Gastrointestinal:  Negative for nausea and vomiting.  Musculoskeletal:  Positive for arthralgias.   Objective:  Physical Exam Well nourished and well developed. General: Alert and oriented x3, cooperative and pleasant, no acute distress. Head: normocephalic, atraumatic, neck supple. Eyes: EOMI.  Musculoskeletal: Right Hip: No tenderness to palpation about the right greater trochanteric bursa. Pain with passive and active motion of the right hip. ROM 0-110 degrees flexion, 10 degrees internal rotation, 10 degrees external rotation, and 20 degrees abduction.  Calves soft and nontender. Motor function intact in LE. Strength 5/5 LE bilaterally. Neuro: Distal pulses 2+. Sensation to light touch intact in LE.  Vital signs in last 24 hours:    Labs:   Estimated body mass index is 33.78 kg/m as calculated from the following:   Height as of 03/28/21: '5\' 5"'$  (1.651 m).   Weight as of 03/28/21: 92.1 kg.   Imaging Review Plain radiographs demonstrate severe degenerative joint disease of the right hip(s). The bone quality appears to be adequate for age and reported activity level.      Assessment/Plan:  End stage arthritis, right hip(s)  The patient history, physical examination, clinical judgement of the provider and imaging studies are consistent with end stage degenerative joint disease of the right hip(s) and total hip arthroplasty is deemed medically necessary. The treatment options including medical management, injection therapy, arthroscopy and arthroplasty were discussed at length. The risks and benefits of total hip arthroplasty were presented and reviewed. The risks due to aseptic loosening, infection, stiffness, dislocation/subluxation,  thromboembolic complications and other imponderables were discussed.  The patient acknowledged the explanation, agreed to  proceed with the plan and consent was signed. Patient is being admitted for inpatient treatment for surgery, pain control, PT, OT, prophylactic antibiotics, VTE prophylaxis, progressive ambulation and ADL's and discharge planning.The patient is planning to be discharged  home.  Therapy Plans: HEP Disposition: Home with wife Planned DVT Prophylaxis: aspirin '81mg'$  BID DME needed: walker, 3-n-1 PCP: Dr. Rachell Cipro, awaiting clearance appointment on 8/22 TXA: IV Allergies: NKDA Anesthesia Concerns: none BMI: 33.6 Last HgbA1c: 6.8%  Other: - Hydrocodone, robaxin, celebrex   Griffith Citron, PA-C Orthopedic Surgery [facility] [place] [phone]

## 2021-04-08 ENCOUNTER — Ambulatory Visit (HOSPITAL_COMMUNITY): Payer: Medicare HMO

## 2021-04-08 ENCOUNTER — Encounter (HOSPITAL_COMMUNITY)
Admission: RE | Disposition: A | Discharge status: Home / Self Care | Payer: Self-pay | Source: Ambulatory Visit | Attending: Orthopedic Surgery

## 2021-04-08 ENCOUNTER — Ambulatory Visit (HOSPITAL_COMMUNITY): Payer: Medicare HMO | Admitting: Anesthesiology

## 2021-04-08 ENCOUNTER — Observation Stay (HOSPITAL_COMMUNITY)
Admission: RE | Admit: 2021-04-08 | Discharge: 2021-04-09 | Disposition: A | Discharge status: Home / Self Care | Payer: Medicare HMO | Source: Ambulatory Visit | Attending: Orthopedic Surgery | Admitting: Orthopedic Surgery

## 2021-04-08 ENCOUNTER — Ambulatory Visit (HOSPITAL_COMMUNITY): Payer: Medicare HMO | Admitting: Physician Assistant

## 2021-04-08 ENCOUNTER — Other Ambulatory Visit: Payer: Self-pay

## 2021-04-08 ENCOUNTER — Observation Stay (HOSPITAL_COMMUNITY): Payer: Medicare HMO

## 2021-04-08 ENCOUNTER — Encounter (HOSPITAL_COMMUNITY): Payer: Self-pay | Admitting: Orthopedic Surgery

## 2021-04-08 DIAGNOSIS — Z471 Aftercare following joint replacement surgery: Secondary | ICD-10-CM | POA: Diagnosis not present

## 2021-04-08 DIAGNOSIS — Z794 Long term (current) use of insulin: Secondary | ICD-10-CM | POA: Diagnosis not present

## 2021-04-08 DIAGNOSIS — M25751 Osteophyte, right hip: Secondary | ICD-10-CM | POA: Diagnosis not present

## 2021-04-08 DIAGNOSIS — Z79899 Other long term (current) drug therapy: Secondary | ICD-10-CM | POA: Diagnosis not present

## 2021-04-08 DIAGNOSIS — Z87891 Personal history of nicotine dependence: Secondary | ICD-10-CM | POA: Diagnosis not present

## 2021-04-08 DIAGNOSIS — Z419 Encounter for procedure for purposes other than remedying health state, unspecified: Secondary | ICD-10-CM

## 2021-04-08 DIAGNOSIS — Z8673 Personal history of transient ischemic attack (TIA), and cerebral infarction without residual deficits: Secondary | ICD-10-CM | POA: Insufficient documentation

## 2021-04-08 DIAGNOSIS — Z96649 Presence of unspecified artificial hip joint: Secondary | ICD-10-CM

## 2021-04-08 DIAGNOSIS — M1611 Unilateral primary osteoarthritis, right hip: Secondary | ICD-10-CM | POA: Diagnosis not present

## 2021-04-08 DIAGNOSIS — Z7984 Long term (current) use of oral hypoglycemic drugs: Secondary | ICD-10-CM | POA: Diagnosis not present

## 2021-04-08 DIAGNOSIS — E119 Type 2 diabetes mellitus without complications: Secondary | ICD-10-CM | POA: Insufficient documentation

## 2021-04-08 DIAGNOSIS — Z96641 Presence of right artificial hip joint: Secondary | ICD-10-CM | POA: Diagnosis not present

## 2021-04-08 DIAGNOSIS — I10 Essential (primary) hypertension: Secondary | ICD-10-CM | POA: Insufficient documentation

## 2021-04-08 HISTORY — PX: TOTAL HIP ARTHROPLASTY: SHX124

## 2021-04-08 LAB — GLUCOSE, CAPILLARY
Glucose-Capillary: 184 mg/dL — ABNORMAL HIGH (ref 70–99)
Glucose-Capillary: 149 mg/dL — ABNORMAL HIGH (ref 70–99)
Glucose-Capillary: 180 mg/dL — ABNORMAL HIGH (ref 70–99)
Glucose-Capillary: 222 mg/dL — ABNORMAL HIGH (ref 70–99)

## 2021-04-08 SURGERY — ARTHROPLASTY, HIP, TOTAL, ANTERIOR APPROACH
Anesthesia: Spinal | Site: Hip | Laterality: Right

## 2021-04-08 MED ORDER — POLYETHYLENE GLYCOL 3350 17 G PO PACK: 17.0000 g | PACK | Freq: Every day | ORAL | Status: DC | PRN

## 2021-04-08 MED ORDER — CEFAZOLIN SODIUM-DEXTROSE 2-4 GM/100ML-% IV SOLN
2.0000 g | Freq: Four times a day (QID) | INTRAVENOUS | Status: AC
Start: 2021-04-08 — End: 2021-04-08
  Administered 2021-04-08 (×2): 2 g via INTRAVENOUS
  Filled 2021-04-08 (×2): qty 100

## 2021-04-08 MED ORDER — CANAGLIFLOZIN 100 MG PO TABS
100.0000 mg | ORAL_TABLET | Freq: Every day | ORAL | Status: DC
  Administered 2021-04-09: 100 mg via ORAL
  Filled 2021-04-08: qty 1

## 2021-04-08 MED ORDER — SODIUM CHLORIDE 0.9 % IR SOLN
Status: DC | PRN
  Administered 2021-04-08: 1000 mL

## 2021-04-08 MED ORDER — FERROUS SULFATE 325 (65 FE) MG PO TABS
325.0000 mg | ORAL_TABLET | Freq: Three times a day (TID) | ORAL | Status: DC
  Administered 2021-04-08 – 2021-04-09 (×3): 325 mg via ORAL
  Filled 2021-04-08 (×3): qty 1

## 2021-04-08 MED ORDER — HYDROCODONE-ACETAMINOPHEN 5-325 MG PO TABS
ORAL_TABLET | ORAL | Status: AC
  Filled 2021-04-08: qty 1

## 2021-04-08 MED ORDER — SODIUM CHLORIDE 0.9 % IV SOLN: INTRAVENOUS | Status: DC

## 2021-04-08 MED ORDER — PROPOFOL 500 MG/50ML IV EMUL
INTRAVENOUS | Status: DC | PRN
  Administered 2021-04-08: 75 ug/kg/min via INTRAVENOUS

## 2021-04-08 MED ORDER — LOSARTAN POTASSIUM-HCTZ 100-25 MG PO TABS: 1.0000 | ORAL_TABLET | Freq: Every day | ORAL | Status: DC

## 2021-04-08 MED ORDER — DIPHENHYDRAMINE HCL 12.5 MG/5ML PO ELIX: 12.5000 mg | ORAL_SOLUTION | ORAL | Status: DC | PRN

## 2021-04-08 MED ORDER — HYDROCODONE-ACETAMINOPHEN 7.5-325 MG PO TABS
1.0000 | ORAL_TABLET | ORAL | Status: DC | PRN
  Administered 2021-04-08 – 2021-04-09 (×4): 2 via ORAL
  Filled 2021-04-08 (×4): qty 2

## 2021-04-08 MED ORDER — ACETAMINOPHEN 325 MG PO TABS
325.0000 mg | ORAL_TABLET | Freq: Four times a day (QID) | ORAL | Status: DC | PRN
  Filled 2021-04-08: qty 2

## 2021-04-08 MED ORDER — STERILE WATER FOR IRRIGATION IR SOLN
Status: DC | PRN
  Administered 2021-04-08: 2000 mL

## 2021-04-08 MED ORDER — PROMETHAZINE HCL 25 MG/ML IJ SOLN: 6.2500 mg | INTRAMUSCULAR | Status: DC | PRN

## 2021-04-08 MED ORDER — HYDROCODONE-ACETAMINOPHEN 5-325 MG PO TABS
1.0000 | ORAL_TABLET | ORAL | Status: DC | PRN
  Administered 2021-04-08: 2 via ORAL
  Administered 2021-04-08: 1 via ORAL
  Filled 2021-04-08: qty 2

## 2021-04-08 MED ORDER — FENTANYL CITRATE (PF) 100 MCG/2ML IJ SOLN
INTRAMUSCULAR | Status: DC | PRN
  Administered 2021-04-08: 100 ug via INTRAVENOUS

## 2021-04-08 MED ORDER — PHENOL 1.4 % MT LIQD: 1.0000 | OROMUCOSAL | Status: DC | PRN

## 2021-04-08 MED ORDER — METFORMIN HCL 500 MG PO TABS
1000.0000 mg | ORAL_TABLET | Freq: Two times a day (BID) | ORAL | Status: DC
  Administered 2021-04-09: 1000 mg via ORAL
  Filled 2021-04-08: qty 2

## 2021-04-08 MED ORDER — FENTANYL CITRATE (PF) 100 MCG/2ML IJ SOLN
INTRAMUSCULAR | Status: AC
  Filled 2021-04-08: qty 2

## 2021-04-08 MED ORDER — INSULIN GLARGINE-YFGN 100 UNIT/ML ~~LOC~~ SOLN
36.0000 [IU] | Freq: Every day | SUBCUTANEOUS | Status: DC
  Administered 2021-04-09: 36 [IU] via SUBCUTANEOUS
  Filled 2021-04-08: qty 0.36

## 2021-04-08 MED ORDER — POVIDONE-IODINE 10 % EX SWAB
2.0000 "application " | Freq: Once | CUTANEOUS | Status: AC
  Administered 2021-04-08: 2 via TOPICAL

## 2021-04-08 MED ORDER — BISACODYL 10 MG RE SUPP: 10.0000 mg | Freq: Every day | RECTAL | Status: DC | PRN

## 2021-04-08 MED ORDER — LACTATED RINGERS IV SOLN: INTRAVENOUS | Status: DC

## 2021-04-08 MED ORDER — PHENYLEPHRINE HCL-NACL 20-0.9 MG/250ML-% IV SOLN
INTRAVENOUS | Status: DC | PRN
  Administered 2021-04-08: 40 ug/min via INTRAVENOUS

## 2021-04-08 MED ORDER — ORAL CARE MOUTH RINSE: 15.0000 mL | Freq: Once | OROMUCOSAL | Status: AC

## 2021-04-08 MED ORDER — METOCLOPRAMIDE HCL 5 MG/ML IJ SOLN: 5.0000 mg | Freq: Three times a day (TID) | INTRAMUSCULAR | Status: DC | PRN

## 2021-04-08 MED ORDER — ASPIRIN 81 MG PO CHEW
81.0000 mg | CHEWABLE_TABLET | Freq: Two times a day (BID) | ORAL | Status: DC
  Administered 2021-04-08 – 2021-04-09 (×2): 81 mg via ORAL
  Filled 2021-04-08 (×2): qty 1

## 2021-04-08 MED ORDER — PHENYLEPHRINE HCL (PRESSORS) 10 MG/ML IV SOLN
INTRAVENOUS | Status: DC | PRN
  Administered 2021-04-08: 120 ug via INTRAVENOUS
  Administered 2021-04-08: 80 ug via INTRAVENOUS
  Administered 2021-04-08: 120 ug via INTRAVENOUS

## 2021-04-08 MED ORDER — CANAGLIFLOZIN-METFORMIN HCL 50-1000 MG PO TABS: 1.0000 | ORAL_TABLET | Freq: Two times a day (BID) | ORAL | Status: DC

## 2021-04-08 MED ORDER — METHOCARBAMOL 500 MG IVPB - SIMPLE MED
INTRAVENOUS | Status: AC
  Administered 2021-04-08: 500 mg via INTRAVENOUS
  Filled 2021-04-08: qty 50

## 2021-04-08 MED ORDER — HYDROCODONE-ACETAMINOPHEN 5-325 MG PO TABS
ORAL_TABLET | ORAL | Status: AC
  Administered 2021-04-08: 1
  Filled 2021-04-08: qty 1

## 2021-04-08 MED ORDER — ACETAMINOPHEN 500 MG PO TABS
1000.0000 mg | ORAL_TABLET | Freq: Once | ORAL | Status: AC
  Administered 2021-04-08: 1000 mg via ORAL
  Filled 2021-04-08: qty 2

## 2021-04-08 MED ORDER — LISINOPRIL 20 MG PO TABS: 40.0000 mg | ORAL_TABLET | Freq: Every day | ORAL | Status: DC

## 2021-04-08 MED ORDER — METOCLOPRAMIDE HCL 5 MG PO TABS
5.0000 mg | ORAL_TABLET | Freq: Three times a day (TID) | ORAL | Status: DC | PRN
  Filled 2021-04-08: qty 2

## 2021-04-08 MED ORDER — DEXAMETHASONE SODIUM PHOSPHATE 10 MG/ML IJ SOLN
8.0000 mg | Freq: Once | INTRAMUSCULAR | Status: AC
  Administered 2021-04-08: 5 mg via INTRAVENOUS

## 2021-04-08 MED ORDER — CHLORHEXIDINE GLUCONATE 0.12 % MT SOLN
15.0000 mL | Freq: Once | OROMUCOSAL | Status: AC
Start: 2021-04-08 — End: 2021-04-08
  Administered 2021-04-08: 15 mL via OROMUCOSAL

## 2021-04-08 MED ORDER — BUPIVACAINE IN DEXTROSE 0.75-8.25 % IT SOLN
INTRATHECAL | Status: DC | PRN
  Administered 2021-04-08: 1.8 mL via INTRATHECAL

## 2021-04-08 MED ORDER — MORPHINE SULFATE (PF) 2 MG/ML IV SOLN
0.5000 mg | INTRAVENOUS | Status: DC | PRN
  Administered 2021-04-08 – 2021-04-09 (×2): 1 mg via INTRAVENOUS
  Filled 2021-04-08 (×2): qty 1

## 2021-04-08 MED ORDER — TRANEXAMIC ACID-NACL 1000-0.7 MG/100ML-% IV SOLN: 1000.0000 mg | Freq: Once | INTRAVENOUS | Status: AC

## 2021-04-08 MED ORDER — ONDANSETRON HCL 4 MG/2ML IJ SOLN
INTRAMUSCULAR | Status: DC | PRN
  Administered 2021-04-08: 4 mg via INTRAVENOUS

## 2021-04-08 MED ORDER — METHOCARBAMOL 500 MG PO TABS
500.0000 mg | ORAL_TABLET | Freq: Four times a day (QID) | ORAL | Status: DC | PRN
  Administered 2021-04-08 – 2021-04-09 (×2): 500 mg via ORAL
  Filled 2021-04-08 (×2): qty 1

## 2021-04-08 MED ORDER — ONDANSETRON HCL 4 MG PO TABS
4.0000 mg | ORAL_TABLET | Freq: Four times a day (QID) | ORAL | Status: DC | PRN
  Filled 2021-04-08: qty 1

## 2021-04-08 MED ORDER — CEFAZOLIN SODIUM-DEXTROSE 2-4 GM/100ML-% IV SOLN
2.0000 g | INTRAVENOUS | Status: AC
  Administered 2021-04-08: 2 g via INTRAVENOUS
  Filled 2021-04-08: qty 100

## 2021-04-08 MED ORDER — PANTOPRAZOLE SODIUM 40 MG PO TBEC
40.0000 mg | DELAYED_RELEASE_TABLET | Freq: Every day | ORAL | Status: DC
  Administered 2021-04-09: 40 mg via ORAL
  Filled 2021-04-08: qty 1

## 2021-04-08 MED ORDER — HYDROCHLOROTHIAZIDE 25 MG PO TABS
25.0000 mg | ORAL_TABLET | Freq: Every day | ORAL | Status: DC
  Filled 2021-04-08: qty 1

## 2021-04-08 MED ORDER — DEXAMETHASONE SODIUM PHOSPHATE 10 MG/ML IJ SOLN
10.0000 mg | Freq: Once | INTRAMUSCULAR | Status: AC
  Administered 2021-04-09: 10 mg via INTRAVENOUS
  Filled 2021-04-08: qty 1

## 2021-04-08 MED ORDER — LOSARTAN POTASSIUM 50 MG PO TABS
100.0000 mg | ORAL_TABLET | Freq: Every day | ORAL | Status: DC
  Filled 2021-04-08: qty 2

## 2021-04-08 MED ORDER — MENTHOL 3 MG MT LOZG: 1.0000 | LOZENGE | OROMUCOSAL | Status: DC | PRN

## 2021-04-08 MED ORDER — TRANEXAMIC ACID-NACL 1000-0.7 MG/100ML-% IV SOLN
INTRAVENOUS | Status: AC
  Administered 2021-04-08: 1000 mg via INTRAVENOUS
  Filled 2021-04-08: qty 100

## 2021-04-08 MED ORDER — ONDANSETRON HCL 4 MG/2ML IJ SOLN
4.0000 mg | Freq: Four times a day (QID) | INTRAMUSCULAR | Status: DC | PRN
  Administered 2021-04-08: 4 mg via INTRAVENOUS
  Filled 2021-04-08: qty 2

## 2021-04-08 MED ORDER — ATORVASTATIN CALCIUM 20 MG PO TABS
20.0000 mg | ORAL_TABLET | Freq: Every evening | ORAL | Status: DC
  Administered 2021-04-08: 20 mg via ORAL
  Filled 2021-04-08: qty 1

## 2021-04-08 MED ORDER — DOCUSATE SODIUM 100 MG PO CAPS
100.0000 mg | ORAL_CAPSULE | Freq: Two times a day (BID) | ORAL | Status: DC
  Administered 2021-04-08 – 2021-04-09 (×2): 100 mg via ORAL
  Filled 2021-04-08 (×2): qty 1

## 2021-04-08 MED ORDER — PHENYLEPHRINE HCL-NACL 20-0.9 MG/250ML-% IV SOLN
INTRAVENOUS | Status: AC
  Filled 2021-04-08: qty 500

## 2021-04-08 MED ORDER — INSULIN ASPART 100 UNIT/ML IJ SOLN
0.0000 [IU] | Freq: Three times a day (TID) | INTRAMUSCULAR | Status: DC
  Administered 2021-04-08 – 2021-04-09 (×2): 3 [IU] via SUBCUTANEOUS
  Administered 2021-04-09: 5 [IU] via SUBCUTANEOUS

## 2021-04-08 MED ORDER — FENTANYL CITRATE PF 50 MCG/ML IJ SOSY: 25.0000 ug | PREFILLED_SYRINGE | INTRAMUSCULAR | Status: DC | PRN

## 2021-04-08 MED ORDER — TRANEXAMIC ACID-NACL 1000-0.7 MG/100ML-% IV SOLN
1000.0000 mg | INTRAVENOUS | Status: AC
  Administered 2021-04-08: 1000 mg via INTRAVENOUS
  Filled 2021-04-08: qty 100

## 2021-04-08 MED ORDER — METHOCARBAMOL 500 MG IVPB - SIMPLE MED
500.0000 mg | Freq: Four times a day (QID) | INTRAVENOUS | Status: DC | PRN
  Filled 2021-04-08: qty 50

## 2021-04-08 SURGICAL SUPPLY — 44 items
ADH SKN CLS APL DERMABOND .7 (GAUZE/BANDAGES/DRESSINGS) ×1
BAG COUNTER SPONGE SURGICOUNT (BAG) IMPLANT
BAG DECANTER FOR FLEXI CONT (MISCELLANEOUS) IMPLANT
BAG SPEC THK2 15X12 ZIP CLS (MISCELLANEOUS)
BAG SPNG CNTER NS LX DISP (BAG)
BAG SURGICOUNT SPONGE COUNTING (BAG)
BAG ZIPLOCK 12X15 (MISCELLANEOUS) IMPLANT
BLADE SAG 18X100X1.27 (BLADE) ×3 IMPLANT
COVER PERINEAL POST (MISCELLANEOUS) ×3 IMPLANT
COVER SURGICAL LIGHT HANDLE (MISCELLANEOUS) ×3 IMPLANT
CUP ACETBLR 52 OD PINNACLE (Hips) ×3 IMPLANT
DERMABOND ADVANCED (GAUZE/BANDAGES/DRESSINGS) ×2
DERMABOND ADVANCED .7 DNX12 (GAUZE/BANDAGES/DRESSINGS) ×1 IMPLANT
DRAPE FOOT SWITCH (DRAPES) ×3 IMPLANT
DRAPE STERI IOBAN 125X83 (DRAPES) ×3 IMPLANT
DRAPE U-SHAPE 47X51 STRL (DRAPES) ×6 IMPLANT
DRESSING AQUACEL AG SP 3.5X10 (GAUZE/BANDAGES/DRESSINGS) ×1 IMPLANT
DRSG AQUACEL AG ADV 3.5X10 (GAUZE/BANDAGES/DRESSINGS) ×2 IMPLANT
DRSG AQUACEL AG SP 3.5X10 (GAUZE/BANDAGES/DRESSINGS) ×3
DURAPREP 26ML APPLICATOR (WOUND CARE) ×3 IMPLANT
ELECT REM PT RETURN 15FT ADLT (MISCELLANEOUS) ×3 IMPLANT
ELIMINATOR HOLE APEX DEPUY (Hips) ×2 IMPLANT
GLOVE SURG ENC MOIS LTX SZ6 (GLOVE) ×6 IMPLANT
GLOVE SURG UNDER LTX SZ7.5 (GLOVE) ×3 IMPLANT
GLOVE SURG UNDER POLY LF SZ6.5 (GLOVE) ×3 IMPLANT
GLOVE SURG UNDER POLY LF SZ7.5 (GLOVE) ×6 IMPLANT
GOWN STRL REUS W/TWL LRG LVL3 (GOWN DISPOSABLE) ×6 IMPLANT
HEAD CERAMIC 36 PLUS5 (Hips) ×2 IMPLANT
HOLDER FOLEY CATH W/STRAP (MISCELLANEOUS) ×3 IMPLANT
KIT TURNOVER KIT A (KITS) ×3 IMPLANT
LINER NEUTRAL 52X36MM PLUS 4 (Liner) ×2 IMPLANT
PACK ANTERIOR HIP CUSTOM (KITS) ×3 IMPLANT
PENCIL SMOKE EVACUATOR (MISCELLANEOUS) IMPLANT
SCREW 6.5MMX30MM (Screw) ×2 IMPLANT
STEM FEM ACTIS HIGH SZ7 (Stem) ×2 IMPLANT
SUT MNCRL AB 4-0 PS2 18 (SUTURE) ×3 IMPLANT
SUT STRATAFIX 0 PDS 27 VIOLET (SUTURE) ×3
SUT VIC AB 1 CT1 36 (SUTURE) ×9 IMPLANT
SUT VIC AB 2-0 CT1 27 (SUTURE) ×6
SUT VIC AB 2-0 CT1 TAPERPNT 27 (SUTURE) ×2 IMPLANT
SUTURE STRATFX 0 PDS 27 VIOLET (SUTURE) ×1 IMPLANT
TRAY FOLEY MTR SLVR 16FR STAT (SET/KITS/TRAYS/PACK) IMPLANT
TUBE SUCTION HIGH CAP CLEAR NV (SUCTIONS) ×3 IMPLANT
WATER STERILE IRR 1000ML POUR (IV SOLUTION) ×3 IMPLANT

## 2021-04-08 NOTE — Discharge Instructions (Signed)

## 2021-04-08 NOTE — Care Plan (Signed)
Ortho Bundle Case Management Note  Patient Details  Name: Samuel Cooke MRN: [id number] Date of Birth: Nov 29, 1960                  R THA on 04-08-21 DCP: Home with wife. 1 story home with 2 ste. DME: RW and 3-in-1 ordered through [place] PT: HEP   DME Arranged:  Walker rolling, 3-N-1 DME Agency:  [employer]  HH Arranged:    [place] Agency:     Additional Comments: Please contact me with any questions of if this plan should need to change.  Marianne Sofia, RN,CCM [facility]  [phone] 04/08/2021, 10:50 AM

## 2021-04-08 NOTE — Anesthesia Procedure Notes (Signed)
Spinal  Patient location during procedure: OR Start time: 04/08/2021 8:27 AM End time: 04/08/2021 8:32 AM Reason for block: surgical anesthesia Staffing Performed: anesthesiologist  Anesthesiologist: Catalina Gravel, MD Preanesthetic Checklist Completed: patient identified, IV checked, risks and benefits discussed, surgical consent, monitors and equipment checked, pre-op evaluation and timeout performed Spinal Block Patient position: sitting Prep: DuraPrep and site prepped and draped Patient monitoring: continuous pulse ox and blood pressure Approach: midline Location: L3-4 Injection technique: single-shot Needle Needle type: Pencan  Needle gauge: 24 G Assessment Events: CSF return Additional Notes Functioning IV was confirmed and monitors were applied. Sterile prep and drape, including hand hygiene, mask and sterile gloves were used. The patient was positioned and the spine was prepped. The skin was anesthetized with lidocaine.  Free flow of clear CSF was obtained prior to injecting local anesthetic into the CSF.  The spinal needle aspirated freely following injection.  The needle was carefully withdrawn.  The patient tolerated the procedure well. Consent was obtained prior to procedure with all questions answered and concerns addressed. Risks including but not limited to bleeding, infection, nerve damage, paralysis, failed block, inadequate analgesia, allergic reaction, high spinal, itching and headache were discussed and the patient wished to proceed.   Hoy Morn, MD

## 2021-04-08 NOTE — Plan of Care (Signed)
  Problem: Education: Goal: Knowledge of the prescribed therapeutic regimen will improve Outcome: Progressing   Problem: Activity: Goal: Ability to avoid complications of mobility impairment will improve Outcome: Progressing   Problem: Pain Management: Goal: Pain level will decrease with appropriate interventions Outcome: Progressing   Problem: Education: Goal: Knowledge of General Education information will improve Description: Including pain rating scale, medication(s)/side effects and non-pharmacologic comfort measures Outcome: Progressing   Problem: Activity: Goal: Risk for activity intolerance will decrease Outcome: Progressing   Problem: Elimination: Goal: Will not experience complications related to bowel motility Outcome: Progressing   Problem: Pain Managment: Goal: General experience of comfort will improve Outcome: Progressing   Problem: Safety: Goal: Ability to remain free from injury will improve Outcome: Progressing   

## 2021-04-08 NOTE — Transfer of Care (Signed)
Immediate Anesthesia Transfer of Care Note  Patient: Samuel Cooke  Procedure(s) Performed: TOTAL HIP ARTHROPLASTY ANTERIOR APPROACH (Right: Hip)  Patient Location: PACU  Anesthesia Type:Spinal  Level of Consciousness: awake, alert  and oriented   Airway & Oxygen Therapy: Patient Spontanous Breathing and Patient connected to face mask oxygen  Post-op Assessment: Report given to RN and Post -op Vital signs reviewed and stable  Post vital signs: Reviewed and stable  Last Vitals:  Vitals Value Taken Time  BP 90/65 04/08/21 0957  Temp    Pulse 93 04/08/21 0958  Resp 16 04/08/21 0958  SpO2 100 % 04/08/21 0958  Vitals shown include unvalidated device data.  Last Pain:  Vitals:   04/08/21 0635  TempSrc: Oral         Complications: No notable events documented.

## 2021-04-08 NOTE — Anesthesia Postprocedure Evaluation (Signed)
Anesthesia Post Note  Patient: Samuel Cooke  Procedure(s) Performed: TOTAL HIP ARTHROPLASTY ANTERIOR APPROACH (Right: Hip)     Patient location during evaluation: PACU Anesthesia Type: Spinal Level of consciousness: oriented, awake and alert and awake Pain management: pain level controlled Vital Signs Assessment: post-procedure vital signs reviewed and stable Respiratory status: spontaneous breathing, respiratory function stable and nonlabored ventilation Cardiovascular status: blood pressure returned to baseline and stable Postop Assessment: no headache, no backache, no apparent nausea or vomiting, patient able to bend at knees and spinal receding Anesthetic complications: no   No notable events documented.  Last Vitals:  Vitals:   04/08/21 1500 04/08/21 1554  BP: 135/84 123/75  Pulse: 94 95  Resp: 16 20  Temp:  36.9 C  SpO2: 94% 96%    Last Pain:  Vitals:   04/08/21 1640  TempSrc:   PainSc: 7                  Catalina Gravel

## 2021-04-08 NOTE — Anesthesia Preprocedure Evaluation (Addendum)
Anesthesia Evaluation  Patient identified by MRN, date of birth, ID band Patient awake    Reviewed: Allergy & Precautions, NPO status , Patient's Chart, lab work & pertinent test results  Airway Mallampati: III  TM Distance: >3 FB Neck ROM: Full    Dental  (+) Dental Advisory Given, Edentulous Lower, Edentulous Upper   Pulmonary former smoker,    Pulmonary exam normal breath sounds clear to auscultation       Cardiovascular hypertension, Pt. on medications Normal cardiovascular exam Rhythm:Regular Rate:Normal     Neuro/Psych PSYCHIATRIC DISORDERS Anxiety Depression TIA   GI/Hepatic Neg liver ROS, GERD  Medicated,  Endo/Other  diabetes, Type 2, Insulin Dependent, Oral Hypoglycemic AgentsObesity   Renal/GU negative Renal ROS     Musculoskeletal  (+) Arthritis , Osteoarthritis,    Abdominal   Peds  Hematology negative hematology ROS (+) Plt 233k   Anesthesia Other Findings Day of surgery medications reviewed with the patient.  Reproductive/Obstetrics                            Anesthesia Physical Anesthesia Plan  ASA: 2  Anesthesia Plan: Spinal   Post-op Pain Management:    Induction: Intravenous  PONV Risk Score and Plan: 1 and Propofol infusion, Midazolam, Dexamethasone and Ondansetron  Airway Management Planned: Natural Airway and Nasal Cannula  Additional Equipment:   Intra-op Plan:   Post-operative Plan:   Informed Consent: I have reviewed the patients History and Physical, chart, labs and discussed the procedure including the risks, benefits and alternatives for the proposed anesthesia with the patient or authorized representative who has indicated his/her understanding and acceptance.     Dental advisory given  Plan Discussed with: CRNA, Anesthesiologist and Surgeon  Anesthesia Plan Comments:         Anesthesia Quick Evaluation

## 2021-04-08 NOTE — Op Note (Signed)
NAME:  Samuel Cooke                ACCOUNT NO.: [id number]      MEDICAL RECORD NO.: [id number]      FACILITY:  [facility]      PHYSICIAN:  Mauri Pole  DATE OF BIRTH:  [birth year]     DATE OF PROCEDURE:  04/08/2021                                 OPERATIVE REPORT         PREOPERATIVE DIAGNOSIS: Right  hip osteoarthritis.      POSTOPERATIVE DIAGNOSIS:  Right hip osteoarthritis.      PROCEDURE:  Right total hip replacement through an anterior approach   utilizing DePuy THR system, component size 52 mm pinnacle cup, a size 36+4 neutral   Altrex liner, a size 7 Hi Actis stem with a 36+5 delta ceramic   ball.      SURGEON:  Pietro Cassis. Alvan Dame, M.D.      ASSISTANT:  Costella Hatcher, PA-C     ANESTHESIA:  Spinal.      SPECIMENS:  None.      COMPLICATIONS:  None.      BLOOD LOSS:  250 cc     DRAINS:  None.      INDICATION OF THE PROCEDURE:  Samuel Cooke is a 60 y.o. male who had   presented to office for evaluation of right hip pain.  Radiographs revealed   progressive degenerative changes with bone-on-bone   articulation of the  hip joint, including subchondral cystic changes and osteophytes.  The patient had painful limited range of   motion significantly affecting their overall quality of life and function.  The patient was failing to    respond to conservative measures including medications and/or injections and activity modification and at this point was ready   to proceed with more definitive measures.  Consent was obtained for   benefit of pain relief.  Specific risks of infection, DVT, component   failure, dislocation, neurovascular injury, and need for revision surgery were reviewed in the office as well discussion of   the anterior versus posterior approach were reviewed.     PROCEDURE IN DETAIL:  The patient was brought to operative theater.   Once adequate anesthesia, preoperative antibiotics, 2 gm of Ancef, 1 gm of Tranexamic Acid, and  10 mg of Decadron were administered, the patient was positioned supine on the [employer] table.  Once the patient was safely positioned with adequate padding of boney prominences we predraped out the hip, and used fluoroscopy to confirm orientation of the pelvis.      The right hip was then prepped and draped from proximal iliac crest to   mid thigh with a shower curtain technique.      Time-out was performed identifying the patient, planned procedure, and the appropriate extremity.     An incision was then made 2 cm lateral to the   anterior superior iliac spine extending over the orientation of the   tensor fascia lata muscle and sharp dissection was carried down to the   fascia of the muscle.      The fascia was then incised.  The muscle belly was identified and swept   laterally and retractor placed along the superior neck.  Following   cauterization of the circumflex vessels and removing some  pericapsular   fat, a second cobra retractor was placed on the inferior neck.  A T-capsulotomy was made along the line of the   superior neck to the trochanteric fossa, then extended proximally and   distally.  Tag sutures were placed and the retractors were then placed   intracapsular.  We then identified the trochanteric fossa and   orientation of my neck cut and then made a neck osteotomy with the femur on traction.  The femoral   head was removed without difficulty or complication.  Traction was let   off and retractors were placed posterior and anterior around the   acetabulum.      The labrum and foveal tissue were debrided.  I began reaming with a 45 mm   reamer and reamed up to 51 mm reamer with good bony bed preparation and a 52 mm  cup was chosen.  The final 52 mm Pinnacle cup was then impacted under fluoroscopy to confirm the depth of penetration and orientation with respect to   Abduction and forward flexion.  A screw was placed into the ilium followed by the hole eliminator.  The  final   36+4 neutral Altrex liner was impacted with good visualized rim fit.  The cup was positioned anatomically within the acetabular portion of the pelvis.      At this point, the femur was rolled to 100 degrees.  Further capsule was   released off the inferior aspect of the femoral neck.  I then   released the superior capsule proximally.  With the leg in a neutral position the hook was placed laterally   along the femur under the vastus lateralis origin and elevated manually and then held in position using the hook attachment on the bed.  The leg was then extended and adducted with the leg rolled to 100   degrees of external rotation.  Retractors were placed along the medial calcar and posteriorly over the greater trochanter.  Once the proximal femur was fully   exposed, I used a box osteotome to set orientation.  I then began   broaching with the starting chili pepper broach and passed this by hand and then broached up to 7.  With the 7 broach in place I chose a high offset neck and did several trial reductions.  The offset was appropriate, leg lengths   appeared to be equal best matched with the +5 head ball trial confirmed radiographically.   Given these findings, I went ahead and dislocated the hip, repositioned all   retractors and positioned the right hip in the extended and abducted position.  The final 7 Hi Actis stem was   chosen and it was impacted down to the level of neck cut.  Based on this   and the trial reductions, a final 36+5 delta ceramic ball was chosen and   impacted onto a clean and dry trunnion, and the hip was reduced.  The   hip had been irrigated throughout the case again at this point.  I did   reapproximate the superior capsular leaflet to the anterior leaflet   using #1 Vicryl.  The fascia of the   tensor fascia lata muscle was then reapproximated using #1 Vicryl and #0 Stratafix sutures.  The   remaining wound was closed with 2-0 Vicryl and running 4-0  Monocryl.   The hip was cleaned, dried, and dressed sterilely using Dermabond and   Aquacel dressing.  The patient was then brought   to  recovery room in stable condition tolerating the procedure well.    Costella Hatcher, PA-C was present for the entirety of the case involved from   preoperative positioning, perioperative retractor management, general   facilitation of the case, as well as primary wound closure as assistant.            Pietro Cassis Alvan Dame, M.D.        04/08/2021 8:36 AM

## 2021-04-08 NOTE — Evaluation (Signed)
Physical Therapy Evaluation Patient Details Name: Samuel Cooke MRN: [id number] DOB: 1961-01-22 Today's Date: 04/08/2021   History of Present Illness  Patient is 60 y.o. male s/p Rt THA anterior approach on 04/08/21 with PMH significant for OA, anxiety, depression, GERD, TIA, HLD, HTN, DMII.    Clinical Impression  VARDAN TREICHLER is a 60 y.o. male POD 0 s/p Rt THA anterior approach. Patient reports independence with mobility at baseline. Patient is now limited by functional impairments (see PT problem list below) and requires min assist for transfers and gait with RW. Patient was able to ambulate ~15 feet with RW and min assist. Patient instructed in exercise to facilitate circulation to manage edema and reduce risk of DVT. Patient will benefit from continued skilled PT interventions to address impairments and progress towards PLOF. Acute PT will follow to progress mobility and stair training in preparation for safe discharge home.     Follow Up Recommendations Follow surgeon's recommendation for DC plan and follow-up therapies    Equipment Recommendations  Rolling walker with 5" wheels;3in1 (PT)    Recommendations for Other Services       Precautions / Restrictions Precautions Precautions: Fall Restrictions Weight Bearing Restrictions: No Other Position/Activity Restrictions: WBAT      Mobility  Bed Mobility Overal bed mobility: Needs Assistance Bed Mobility: Supine to Sit     Supine to sit: Min assist;HOB elevated     General bed mobility comments: cues to use bed rail and assist to bring Rt LE off EOB. no overt LOB noted.    Transfers Overall transfer level: Needs assistance Equipment used: Rolling walker (2 wheeled) Transfers: Sit to/from Stand Sit to Stand: Min assist;From elevated surface         General transfer comment: bd slightly elevated, cue for hand placement/power up, assist to complete rise, pt steady once  standing.  Ambulation/Gait Ambulation/Gait assistance: Min assist Gait Distance (Feet): 15 Feet Assistive device: Rolling walker (2 wheeled) Gait Pattern/deviations: Step-to pattern;Decreased stride length Gait velocity: decr   General Gait Details: cues for step pattern and proximity to RW. pt with slow cautious steps but cues needed to slow down walker advancement. pt improved throughout.  Stairs            Wheelchair Mobility    Modified Rankin (Stroke Patients Only)       Balance Overall balance assessment: Needs assistance Sitting-balance support: Feet supported Sitting balance-Leahy Scale: Fair     Standing balance support: During functional activity;Bilateral upper extremity supported Standing balance-Leahy Scale: Poor                               Pertinent Vitals/Pain Pain Assessment: Faces Faces Pain Scale: Hurts even more Pain Location: Rt hip Pain Descriptors / Indicators: Aching;Tightness Pain Intervention(s): Limited activity within patient's tolerance;Monitored during session;Premedicated before session;Repositioned;Ice applied    Home Living Family/patient expects to be discharged to:: Private residence Living Arrangements: Spouse/significant other Available Help at Discharge: Family Type of Home: Apartment Home Access: Stairs to enter Entrance Stairs-Rails: Psychiatric nurse of Steps: 2 Home Layout: One level Home Equipment: None      Prior Function Level of Independence: Independent               Hand Dominance   Dominant Hand: Right    Extremity/Trunk Assessment   Upper Extremity Assessment Upper Extremity Assessment: Overall WFL for tasks assessed    Lower Extremity Assessment Lower Extremity  Assessment: Overall WFL for tasks assessed    Cervical / Trunk Assessment Cervical / Trunk Assessment: Normal  Communication   Communication: No difficulties  Cognition Arousal/Alertness:  Awake/alert Behavior During Therapy: WFL for tasks assessed/performed Overall Cognitive Status: Within Functional Limits for tasks assessed                                        General Comments      Exercises Total Joint Exercises Ankle Circles/Pumps: AROM;Both;20 reps;Seated   Assessment/Plan    PT Assessment Patient needs continued PT services  PT Problem List Decreased strength;Decreased range of motion;Decreased activity tolerance;Decreased balance;Decreased mobility;Decreased knowledge of use of DME;Decreased knowledge of precautions;Pain       PT Treatment Interventions DME instruction;Gait training;Stair training;Functional mobility training;Therapeutic activities;Therapeutic exercise;Balance training;Patient/family education    PT Goals (Current goals can be found in the Care Plan section)  Acute Rehab PT Goals Patient Stated Goal: get back to playing with granddaugther PT Goal Formulation: With patient Time For Goal Achievement: 04/15/21 Potential to Achieve Goals: Good    Frequency 7X/week   Barriers to discharge        Co-evaluation               AM-PAC PT "6 Clicks" Mobility  Outcome Measure Help needed turning from your back to your side while in a flat bed without using bedrails?: A Little Help needed moving from lying on your back to sitting on the side of a flat bed without using bedrails?: A Little Help needed moving to and from a bed to a chair (including a wheelchair)?: A Little Help needed standing up from a chair using your arms (e.g., wheelchair or bedside chair)?: A Little Help needed to walk in hospital room?: A Little Help needed climbing 3-5 steps with a railing? : A Little 6 Click Score: 18    End of Session Equipment Utilized During Treatment: Gait belt Activity Tolerance: Patient tolerated treatment well Patient left: in chair;with call bell/phone within reach;with chair alarm set Nurse Communication: Mobility  status PT Visit Diagnosis: Muscle weakness (generalized) (M62.81);Difficulty in walking, not elsewhere classified (R26.2)    Time: [id number] PT Time Calculation (min) (ACUTE ONLY): 18 min   Charges:   PT Evaluation $PT Eval Low Complexity: 1 Low          Verner Mould, DPT Acute Rehabilitation Services Office [phone] Pager [phone]   Jacques Navy 04/08/2021, 7:19 PM

## 2021-04-09 ENCOUNTER — Encounter (HOSPITAL_COMMUNITY): Payer: Self-pay | Admitting: Orthopedic Surgery

## 2021-04-09 DIAGNOSIS — E119 Type 2 diabetes mellitus without complications: Secondary | ICD-10-CM | POA: Diagnosis not present

## 2021-04-09 DIAGNOSIS — Z87891 Personal history of nicotine dependence: Secondary | ICD-10-CM | POA: Diagnosis not present

## 2021-04-09 DIAGNOSIS — I10 Essential (primary) hypertension: Secondary | ICD-10-CM | POA: Diagnosis not present

## 2021-04-09 DIAGNOSIS — Z794 Long term (current) use of insulin: Secondary | ICD-10-CM | POA: Diagnosis not present

## 2021-04-09 DIAGNOSIS — Z8673 Personal history of transient ischemic attack (TIA), and cerebral infarction without residual deficits: Secondary | ICD-10-CM | POA: Diagnosis not present

## 2021-04-09 DIAGNOSIS — Z79899 Other long term (current) drug therapy: Secondary | ICD-10-CM | POA: Diagnosis not present

## 2021-04-09 DIAGNOSIS — M1611 Unilateral primary osteoarthritis, right hip: Secondary | ICD-10-CM | POA: Diagnosis not present

## 2021-04-09 DIAGNOSIS — E1165 Type 2 diabetes mellitus with hyperglycemia: Secondary | ICD-10-CM | POA: Diagnosis not present

## 2021-04-09 LAB — BASIC METABOLIC PANEL
Anion gap: 8 (ref 5–15)
BUN: 16 mg/dL (ref 6–20)
CO2: 26 mmol/L (ref 22–32)
Calcium: 8.8 mg/dL — ABNORMAL LOW (ref 8.9–10.3)
Chloride: 105 mmol/L (ref 98–111)
Creatinine, Ser: 1.16 mg/dL (ref 0.61–1.24)
GFR, Estimated: >60 mL/min (ref >60)
Glucose, Bld: 180 mg/dL — ABNORMAL HIGH (ref 70–99)
Potassium: 4.1 mmol/L (ref 3.5–5.1)
Sodium: 139 mmol/L (ref 135–145)

## 2021-04-09 LAB — GLUCOSE, CAPILLARY
Glucose-Capillary: 170 mg/dL — ABNORMAL HIGH (ref 70–99)
Glucose-Capillary: 190 mg/dL — ABNORMAL HIGH (ref 70–99)
Glucose-Capillary: 217 mg/dL — ABNORMAL HIGH (ref 70–99)

## 2021-04-09 LAB — CBC
HCT: 39 % (ref 39.0–52.0)
Hemoglobin: 12.7 g/dL — ABNORMAL LOW (ref 13.0–17.0)
MCH: 28.2 pg (ref 26.0–34.0)
MCHC: 32.6 g/dL (ref 30.0–36.0)
MCV: 86.7 fL (ref 80.0–100.0)
Platelets: 184 10*3/uL (ref 150–400)
RBC: 4.5 MIL/uL (ref 4.22–5.81)
RDW: 13.2 % (ref 11.5–15.5)
WBC: 8.6 10*3/uL (ref 4.0–10.5)
nRBC: 0 % (ref 0.0–0.2)

## 2021-04-09 MED ORDER — DOCUSATE SODIUM 100 MG PO CAPS: 100.0000 mg | ORAL_CAPSULE | Freq: Two times a day (BID) | ORAL | 0 refills | Status: AC

## 2021-04-09 MED ORDER — HYDROCODONE-ACETAMINOPHEN 7.5-325 MG PO TABS: 1.0000 | ORAL_TABLET | Freq: Four times a day (QID) | ORAL | 0 refills | Status: AC | PRN

## 2021-04-09 MED ORDER — POLYETHYLENE GLYCOL 3350 17 G PO PACK: 17.0000 g | PACK | Freq: Every day | ORAL | 0 refills | Status: AC | PRN

## 2021-04-09 MED ORDER — METHOCARBAMOL 500 MG PO TABS: 500.0000 mg | ORAL_TABLET | Freq: Four times a day (QID) | ORAL | 0 refills | Status: AC | PRN

## 2021-04-09 MED ORDER — ASPIRIN 81 MG PO CHEW: 81.0000 mg | CHEWABLE_TABLET | Freq: Two times a day (BID) | ORAL | 0 refills | Status: AC

## 2021-04-09 NOTE — TOC Transition Note (Addendum)
Transition of Care [facility]) - CM/SW Discharge Note   Patient Details  Name: Samuel Cooke MRN: [id number] Date of Birth: 23-Sep-1960  [facility] CM/SW Contact:  Lennart Pall, LCSW Phone Number: 04/09/2021, 9:48 AM   Clinical Narrative:    Met briefly with pt and confirming receipt of rw and 3n1 via [place].  Plan HEP.  No TOC needs.   Final next level of care: Home/Self Care Barriers to Discharge: No Barriers Identified   Patient Goals and CMS Choice Patient states their goals for this hospitalization and ongoing recovery are:: return home      Discharge Placement                       Discharge Plan and Services                DME Arranged: Walker rolling, 3-N-1 DME Agency: [facility] Determinants of Health (SDOH) Interventions     Readmission Risk Interventions No flowsheet data found.

## 2021-04-09 NOTE — Progress Notes (Signed)
   Subjective: 1 Day Post-Op Procedure(s) (LRB): TOTAL HIP ARTHROPLASTY ANTERIOR APPROACH (Right) Patient reports pain as moderate.   Patient seen in rounds with Dr. Alvan Dame. Patient is resting in bed this morning on exam. He reports his pain was significant yesterday. He ambulated 15 feet with PT. Foley catheter removed.  We will continue therapy today.   Objective: Vital signs in last 24 hours: Temp:  [97.7 F (36.5 C)-98.4 F (36.9 C)] 98.4 F (36.9 C) (08/30 1936) Pulse Rate:  [78-107] 95 (08/31 0509) Resp:  [14-20] 18 (08/31 0509) BP: (90-157)/(61-91) 125/73 (08/31 0509) SpO2:  [93 %-100 %] 94 % (08/31 0509) Weight:  [92.1 kg] 92.1 kg (08/30 1554)  Intake/Output from previous day:  Intake/Output Summary (Last 24 hours) at 04/09/2021 0726 Last data filed at 04/09/2021 0605 Gross per 24 hour  Intake 4205.99 ml  Output 3650 ml  Net 555.99 ml     Intake/Output this shift: No intake/output data recorded.  Labs: Recent Labs    04/09/21 0316  HGB 12.7*   Recent Labs    04/09/21 0316  WBC 8.6  RBC 4.50  HCT 39.0  PLT 184   Recent Labs    04/09/21 0316  NA 139  K 4.1  CL 105  CO2 26  BUN 16  CREATININE 1.16  GLUCOSE 180*  CALCIUM 8.8*   No results for input(s): LABPT, INR in the last 72 hours.  Exam: General - Patient is Alert and Oriented Extremity - Neurologically intact Sensation intact distally Intact pulses distally Dorsiflexion/Plantar flexion intact Dressing - dressing C/D/I Motor Function - intact, moving foot and toes well on exam.   Past Medical History:  Diagnosis Date   Anxiety    Arthritis    hips and lower back    DDD (degenerative disc disease), lumbosacral    Depression    Diverticulosis of colon    GERD (gastroesophageal reflux disease)    History of transient ischemic attack (TIA)    12-13-2010   Hyperlipidemia    Hypertension    Phimosis    Type 2 diabetes mellitus (HCC)    Wears glasses     Assessment/Plan: 1 Day  Post-Op Procedure(s) (LRB): TOTAL HIP ARTHROPLASTY ANTERIOR APPROACH (Right) Active Problems:   S/P right total hip arthroplasty  Estimated body mass index is 33.79 kg/m as calculated from the following:   Height as of this encounter: '5\' 5"'$  (1.651 m).   Weight as of this encounter: 92.1 kg. Advance diet Up with therapy D/C IV fluids  DVT Prophylaxis - Aspirin Weight bearing as tolerated.  Plan is to go Home after hospital stay. Up with PT today. Plan for discharge home after 1-2 sessions as long as he is meeting his goals and pain is managed. Follow up in the office in 2 weeks.   Griffith Citron, PA-C Orthopedic Surgery [phone] 04/09/2021, 7:26 AM

## 2021-04-09 NOTE — Plan of Care (Signed)
  Problem: Activity: Goal: Ability to avoid complications of mobility impairment will improve Outcome: Progressing   Problem: Pain Managment: Goal: General experience of comfort will improve Outcome: Progressing   Problem: Safety: Goal: Ability to remain free from injury will improve Outcome: Progressing   

## 2021-04-09 NOTE — Progress Notes (Signed)
Physical Therapy Treatment Patient Details Name: Samuel Cooke MRN: [id number] DOB: September 21, 1960 Today's Date: 04/09/2021    History of Present Illness Patient is 60 y.o. male s/p Rt THA anterior approach on 04/08/21 with PMH significant for OA, anxiety, depression, GERD, TIA, HLD, HTN, DMII.    PT Comments     POD # 1 AM SESSION Assisted OOB.  General bed mobility comments: demonstatrted and instructed how to use a belt to self assist LE off bed.  General transfer comment: 25% VC;s on proper hand placement and safety with turns.  Increased time. General Gait Details: 25% VC's on proper walker to self distance as well as proper sequencing.  Increased time.  Slow but steady.  Pt reports pain is 7/10 "tight". PRACTICED STAIRS. Then returned to room to perform some TE's following HEP handout.  Instructed on proper tech, freq as well as use of ICE.     Follow Up Recommendations  Follow surgeon's recommendation for DC plan and follow-up therapies;Home health PT     Equipment Recommendations  Rolling walker with 5" wheels;3in1 (PT)    Recommendations for Other Services       Precautions / Restrictions Precautions Precautions: Fall Restrictions Weight Bearing Restrictions: No    Mobility  Bed Mobility Overal bed mobility: Needs Assistance Bed Mobility: Supine to Sit     Supine to sit: Min guard;Supervision     General bed mobility comments: demonstatrted and instructed how to use a belt to self assist LE off bed    Transfers Overall transfer level: Needs assistance Equipment used: Rolling walker (2 wheeled) Transfers: Sit to/from Stand Sit to Stand: Supervision         General transfer comment: 25% VC;s on proper hand placement and safety with turns.  Increased time.  Ambulation/Gait Ambulation/Gait assistance: Supervision;Min guard Gait Distance (Feet): 35 Feet Assistive device: Rolling walker (2 wheeled) Gait Pattern/deviations: Step-to pattern;Decreased stride  length Gait velocity: decreased   General Gait Details: 25% VC's on proper walker to self distance as well as proper sequencing.  Increased time.  Slow but steady.  Pt reports pain is 7/10 "tight".   Stairs Stairs: Yes Stairs assistance: Min guard;Min assist Stair Management: One rail Right;Step to pattern;Forwards;Sideways Number of Stairs: 4 General stair comments: 50% VC's on proper sequencing plus 2 hands on one rail for increased safety.   Wheelchair Mobility    Modified Rankin (Stroke Patients Only)       Balance                                            Cognition Arousal/Alertness: Awake/alert Behavior During Therapy: WFL for tasks assessed/performed Overall Cognitive Status: Within Functional Limits for tasks assessed                                 General Comments: AxO x 3 very motivated      Exercises  Total Hip Replacement TE's following HEP Handout 10 reps ankle pumps 05 reps knee presses 05 reps heel slides 05 reps SAQ's 05 reps ABD Instructed how to use a belt loop to assist  Followed by ICE     General Comments        Pertinent Vitals/Pain Pain Assessment: 0-10 Pain Score: 7  Pain Location: Rt hip Pain Descriptors / Indicators: Aching;Tightness;Operative site guarding  Pain Intervention(s): Monitored during session;Premedicated before session;Repositioned;Ice applied    Home Living                      Prior Function            PT Goals (current goals can now be found in the care plan section) Progress towards PT goals: Progressing toward goals    Frequency    7X/week      PT Plan Current plan remains appropriate    Co-evaluation              AM-PAC PT "6 Clicks" Mobility   Outcome Measure  Help needed turning from your back to your side while in a flat bed without using bedrails?: A Little Help needed moving from lying on your back to sitting on the side of a flat bed  without using bedrails?: A Little Help needed moving to and from a bed to a chair (including a wheelchair)?: A Little Help needed standing up from a chair using your arms (e.g., wheelchair or bedside chair)?: A Little Help needed to walk in hospital room?: A Little Help needed climbing 3-5 steps with a railing? : A Little 6 Click Score: 18    End of Session   Activity Tolerance: Patient tolerated treatment well Patient left: in chair;with call bell/phone within reach;with chair alarm set Nurse Communication: Mobility status (pt will need another PT session prior to D/C to home today) PT Visit Diagnosis: Muscle weakness (generalized) (M62.81);Difficulty in walking, not elsewhere classified (R26.2)     Time: [id number] PT Time Calculation (min) (ACUTE ONLY): 28 min  Charges:  $Gait Training: 8-22 mins $Therapeutic Activity: 8-22 mins                     {Kaedynce Tapp  PTA Acute  Rehabilitation Services Pager      [phone] Office      [phone]

## 2021-04-09 NOTE — Plan of Care (Signed)
  Problem: Education: Goal: Knowledge of the prescribed therapeutic regimen will improve Outcome: Adequate for Discharge Goal: Understanding of discharge needs will improve Outcome: Adequate for Discharge Goal: Individualized Educational Video(s) Outcome: Adequate for Discharge   Problem: Activity: Goal: Ability to avoid complications of mobility impairment will improve Outcome: Adequate for Discharge Goal: Ability to tolerate increased activity will improve Outcome: Adequate for Discharge   Problem: Clinical Measurements: Goal: Postoperative complications will be avoided or minimized Outcome: Adequate for Discharge   Problem: Pain Management: Goal: Pain level will decrease with appropriate interventions Outcome: Adequate for Discharge   Problem: Education: Goal: Knowledge of General Education information will improve Description: Including pain rating scale, medication(s)/side effects and non-pharmacologic comfort measures Outcome: Adequate for Discharge   Problem: Health Behavior/Discharge Planning: Goal: Ability to manage health-related needs will improve Outcome: Adequate for Discharge   Problem: Clinical Measurements: Goal: Ability to maintain clinical measurements within normal limits will improve Outcome: Adequate for Discharge Goal: Will remain free from infection Outcome: Adequate for Discharge Goal: Diagnostic test results will improve Outcome: Adequate for Discharge Goal: Respiratory complications will improve Outcome: Adequate for Discharge Goal: Cardiovascular complication will be avoided Outcome: Adequate for Discharge   Problem: Activity: Goal: Risk for activity intolerance will decrease Outcome: Adequate for Discharge   Problem: Nutrition: Goal: Adequate nutrition will be maintained Outcome: Adequate for Discharge   Problem: Coping: Goal: Level of anxiety will decrease Outcome: Adequate for Discharge   Problem: Elimination: Goal: Will not  experience complications related to bowel motility Outcome: Adequate for Discharge Goal: Will not experience complications related to urinary retention Outcome: Adequate for Discharge   Problem: Pain Managment: Goal: General experience of comfort will improve Outcome: Adequate for Discharge   Problem: Safety: Goal: Ability to remain free from injury will improve Outcome: Adequate for Discharge   Problem: Acute Rehab PT Goals(only PT should resolve) Goal: Pt Will Go Supine/Side To Sit Outcome: Adequate for Discharge Goal: Patient Will Transfer Sit To/From Stand Outcome: Adequate for Discharge Goal: Pt Will Ambulate Outcome: Adequate for Discharge Goal: Pt Will Go Up/Down Stairs Outcome: Adequate for Discharge

## 2021-04-09 NOTE — Progress Notes (Signed)
Physical Therapy Treatment Patient Details Name: Samuel Cooke MRN: [id number] DOB: 08/28/1960 Today's Date: 04/09/2021    History of Present Illness Patient is 60 y.o. male s/p Rt THA anterior approach on 04/08/21 with PMH significant for OA, anxiety, depression, GERD, TIA, HLD, HTN, DMII.    PT Comments    POD #1 pm session Assisted with amb an increased distance. Practiced stairs again.  Then returned to room to perform some TE's following HEP handout.  Instructed on proper tech, freq as well as use of ICE.   Addressed all mobility questions, discussed appropriate activity, educated on use of ICE.  Pt ready for D/C to home.   Follow Up Recommendations  Follow surgeon's recommendation for DC plan and follow-up therapies;Home health PT     Equipment Recommendations  Rolling walker with 5" wheels;3in1 (PT)    Recommendations for Other Services       Precautions / Restrictions Precautions Precautions: Fall Restrictions Weight Bearing Restrictions: No    Mobility  Bed Mobility Overal bed mobility: Needs Assistance Bed Mobility: Supine to Sit     Supine to sit: Min guard;Supervision     General bed mobility comments: OOB in recliner    Transfers Overall transfer level: Needs assistance Equipment used: Rolling walker (2 wheeled) Transfers: Sit to/from Stand Sit to Stand: Supervision         General transfer comment: 25% VC;s on proper hand placement and safety with turns.  Increased time.  Ambulation/Gait Ambulation/Gait assistance: Supervision;Min guard Gait Distance (Feet): 75 Feet Assistive device: Rolling walker (2 wheeled) Gait Pattern/deviations: Step-to pattern;Decreased stride length Gait velocity: decreased   General Gait Details: 25% VC's on proper walker to self distance as well as proper sequencing.  Increased time.  Slow but steady.  Pt reports pain is 7/10 "tight".   Stairs Stairs: Yes Stairs assistance: Min guard;Min assist Stair  Management: One rail Right;Step to pattern;Forwards;Sideways Number of Stairs: 4 General stair comments: 25% VC's on proper sequencing plus 2 hands on one rail for increased safety.   Wheelchair Mobility    Modified Rankin (Stroke Patients Only)       Balance                                            Cognition Arousal/Alertness: Awake/alert Behavior During Therapy: WFL for tasks assessed/performed Overall Cognitive Status: Within Functional Limits for tasks assessed                                 General Comments: AxO x 3 very motivated      Exercises   10 reps all standing TE's following handout.   General Comments        Pertinent Vitals/Pain Pain Assessment: 0-10 Pain Score: 7  Pain Location: Rt hip Pain Descriptors / Indicators: Aching;Tightness;Operative site guarding Pain Intervention(s): Monitored during session;Premedicated before session;Repositioned;Ice applied    Home Living                      Prior Function            PT Goals (current goals can now be found in the care plan section) Progress towards PT goals: Progressing toward goals    Frequency    7X/week      PT Plan Current plan remains appropriate  Co-evaluation              AM-PAC PT "6 Clicks" Mobility   Outcome Measure  Help needed turning from your back to your side while in a flat bed without using bedrails?: A Little Help needed moving from lying on your back to sitting on the side of a flat bed without using bedrails?: A Little Help needed moving to and from a bed to a chair (including a wheelchair)?: A Little Help needed standing up from a chair using your arms (e.g., wheelchair or bedside chair)?: A Little Help needed to walk in hospital room?: A Little Help needed climbing 3-5 steps with a railing? : A Little 6 Click Score: 18    End of Session   Activity Tolerance: Patient tolerated treatment well Patient left:  in chair;with call bell/phone within reach;with chair alarm set Nurse Communication: Mobility status (pt will need another PT session prior to D/C to home today) PT Visit Diagnosis: Muscle weakness (generalized) (M62.81);Difficulty in walking, not elsewhere classified (R26.2)     Time: [id number] PT Time Calculation (min) (ACUTE ONLY): 24 min  Charges:  $Gait Training: 8-22 mins $Therapeutic Exercise: 8-22 mins                        Rica Koyanagi  PTA Acute  Rehabilitation Services Pager      [phone] Office      [phone]

## 2021-04-10 NOTE — Discharge Summary (Signed)
Physician Discharge Summary   Patient ID: Samuel Cooke MRN: [id number] DOB/AGE: Jan 13, 1961 60 y.o.  Admit date: 04/08/2021 Discharge date: 04/09/2021  Primary Diagnosis: Right hip osteoarthritis.  Admission Diagnoses:  Past Medical History:  Diagnosis Date   Anxiety    Arthritis    hips and lower back    DDD (degenerative disc disease), lumbosacral    Depression    Diverticulosis of colon    GERD (gastroesophageal reflux disease)    History of transient ischemic attack (TIA)    12-13-2010   Hyperlipidemia    Hypertension    Phimosis    Type 2 diabetes mellitus ([place])    Wears glasses    Discharge Diagnoses:   Active Problems:   S/P right total hip arthroplasty  Estimated body mass index is 33.79 kg/m as calculated from the following:   Height as of this encounter: '5\' 5"'$  (1.651 m).   Weight as of this encounter: 92.1 kg.  Procedure:  Procedure(s) (LRB): TOTAL HIP ARTHROPLASTY ANTERIOR APPROACH (Right)   Consults: None  HPI:  Samuel Cooke is a 60 y.o. male who had   presented to office for evaluation of right hip pain.  Radiographs revealed   progressive degenerative changes with bone-on-bone   articulation of the  hip joint, including subchondral cystic changes and osteophytes.  The patient had painful limited range of   motion significantly affecting their overall quality of life and function.  The patient was failing to    respond to conservative measures including medications and/or injections and activity modification and at this point was ready   to proceed with more definitive measures.  Consent was obtained for   benefit of pain relief.  Specific risks of infection, DVT, component   failure, dislocation, neurovascular injury, and need for revision surgery were reviewed in the office as well discussion of   the anterior versus posterior approach were reviewed.  Laboratory Data: Admission on 04/08/2021, Discharged on 04/09/2021  Component Date Value  Ref Range Status   Glucose-Capillary 04/08/2021 184 (A) 70 - 99 mg/dL Final   Glucose reference range applies only to samples taken after fasting for at least 8 hours.   Comment 1 04/08/2021 Notify RN   Final   Comment 2 04/08/2021 Document in Chart   Final   Glucose-Capillary 04/08/2021 149 (A) 70 - 99 mg/dL Final   Glucose reference range applies only to samples taken after fasting for at least 8 hours.   Glucose-Capillary 04/08/2021 180 (A) 70 - 99 mg/dL Final   Glucose reference range applies only to samples taken after fasting for at least 8 hours.   WBC 04/09/2021 8.6  4.0 - 10.5 K/uL Final   RBC 04/09/2021 4.50  4.22 - 5.81 MIL/uL Final   Hemoglobin 04/09/2021 12.7 (A) 13.0 - 17.0 g/dL Final   HCT 04/09/2021 39.0  39.0 - 52.0 % Final   MCV 04/09/2021 86.7  80.0 - 100.0 fL Final   MCH 04/09/2021 28.2  26.0 - 34.0 pg Final   MCHC 04/09/2021 32.6  30.0 - 36.0 g/dL Final   RDW 04/09/2021 13.2  11.5 - 15.5 % Final   Platelets 04/09/2021 184  150 - 400 K/uL Final   nRBC 04/09/2021 0.0  0.0 - 0.2 % Final   Performed at [facility], [place] [address], [place], [place] [address]   Sodium 04/09/2021 139  135 - 145 mmol/L Final   Potassium 04/09/2021 4.1  3.5 - 5.1 mmol/L Final   Chloride  04/09/2021 105  98 - 111 mmol/L Final   CO2 04/09/2021 26  22 - 32 mmol/L Final   Glucose, Bld 04/09/2021 180 (A) 70 - 99 mg/dL Final   Glucose reference range applies only to samples taken after fasting for at least 8 hours.   BUN 04/09/2021 16  6 - 20 mg/dL Final   Creatinine, Ser 04/09/2021 1.16  0.61 - 1.24 mg/dL Final   Calcium 04/09/2021 8.8 (A) 8.9 - 10.3 mg/dL Final   GFR, Estimated 04/09/2021 >60  >60 mL/min Final   Comment: (NOTE) Calculated using the CKD-EPI Creatinine Equation (2021)    Anion gap 04/09/2021 8  5 - 15 Final   Performed at [facility], [place] [address] [place], [place] [address]   Glucose-Capillary 04/08/2021 222 (A) 70 - 99 mg/dL Final    Glucose reference range applies only to samples taken after fasting for at least 8 hours.   Glucose-Capillary 04/09/2021 170 (A) 70 - 99 mg/dL Final   Glucose reference range applies only to samples taken after fasting for at least 8 hours.   Glucose-Capillary 04/09/2021 190 (A) 70 - 99 mg/dL Final   Glucose reference range applies only to samples taken after fasting for at least 8 hours.   Glucose-Capillary 04/09/2021 217 (A) 70 - 99 mg/dL Final   Glucose reference range applies only to samples taken after fasting for at least 8 hours.  Orders Only on 04/02/2021  Component Date Value Ref Range Status   SARS Coronavirus 2 04/02/2021 RESULT: NEGATIVE   Final   Comment: RESULT: NEGATIVESARS-CoV-2 INTERPRETATION:A NEGATIVE  test result means that SARS-CoV-2 RNA was not present in the specimen above the limit of detection of this test. This does not preclude a possible SARS-CoV-2 infection and should not be used as the  sole basis for patient management decisions. Negative results must be combined with clinical observations, patient history, and epidemiological information. Optimum specimen types and timing for peak viral levels during infections caused by SARS-CoV-2  have not been determined. Collection of multiple specimens or types of specimens may be necessary to detect virus. Improper specimen collection and handling, sequence variability under primers/probes, or organism present below the limit of detection may  lead to false negative results. Positive and negative predictive values of testing are highly dependent on prevalence. False negative test results are more likely when prevalence of disease is high.The expected result is NEGATIVE.Fact S                          heet for  Healthcare Providers: LocalChronicle.no Sheet for Patients: SalonLookup.es Reference Range - Negative   Hospital Outpatient Visit on 03/28/2021  Component Date  Value Ref Range Status   MRSA, PCR 03/28/2021 NEGATIVE  NEGATIVE Final   Staphylococcus aureus 03/28/2021 NEGATIVE  NEGATIVE Final   Comment: (NOTE) The Xpert SA Assay (FDA approved for NASAL specimens in patients 41 years of age and older), is one component of a comprehensive surveillance program. It is not intended to diagnose infection nor to guide or monitor treatment. Performed at [facility], [place] [address] [place], [place] [address]    WBC 03/28/2021 6.1  4.0 - 10.5 K/uL Final   RBC 03/28/2021 5.69  4.22 - 5.81 MIL/uL Final   Hemoglobin 03/28/2021 16.1  13.0 - 17.0 g/dL Final   HCT 03/28/2021 49.7  39.0 - 52.0 % Final   MCV 03/28/2021 87.3  80.0 - 100.0 fL Final  MCH 03/28/2021 28.3  26.0 - 34.0 pg Final   MCHC 03/28/2021 32.4  30.0 - 36.0 g/dL Final   RDW 03/28/2021 13.3  11.5 - 15.5 % Final   Platelets 03/28/2021 233  150 - 400 K/uL Final   nRBC 03/28/2021 0.0  0.0 - 0.2 % Final   Performed at [facility], [place] [address], [place], [place] [address]   Sodium 03/28/2021 137  135 - 145 mmol/L Final   Potassium 03/28/2021 4.1  3.5 - 5.1 mmol/L Final   Chloride 03/28/2021 99  98 - 111 mmol/L Final   CO2 03/28/2021 26  22 - 32 mmol/L Final   Glucose, Bld 03/28/2021 146 (A) 70 - 99 mg/dL Final   Glucose reference range applies only to samples taken after fasting for at least 8 hours.   BUN 03/28/2021 18  6 - 20 mg/dL Final   Creatinine, Ser 03/28/2021 1.02  0.61 - 1.24 mg/dL Final   Calcium 03/28/2021 10.4 (A) 8.9 - 10.3 mg/dL Final   Total Protein 03/28/2021 7.7  6.5 - 8.1 g/dL Final   Albumin 03/28/2021 4.2  3.5 - 5.0 g/dL Final   AST 03/28/2021 36  15 - 41 U/L Final   ALT 03/28/2021 50 (A) 0 - 44 U/L Final   Alkaline Phosphatase 03/28/2021 51  38 - 126 U/L Final   Total Bilirubin 03/28/2021 0.6  0.3 - 1.2 mg/dL Final   GFR, Estimated 03/28/2021 >60  >60 mL/min Final   Comment: (NOTE) Calculated using the CKD-EPI Creatinine Equation  (2021)    Anion gap 03/28/2021 12  5 - 15 Final   Performed at [facility], [place] [address], [place], [place] [address]   Glucose-Capillary 03/28/2021 181 (A) 70 - 99 mg/dL Final   Glucose reference range applies only to samples taken after fasting for at least 8 hours.   Transfuse no blood products 03/28/2021    Final                   Value:TRANSFUSE NO BLOOD PRODUCTS, VERIFIED BY Gillian Shields, RN Performed at [facility], [place] [address], [place],  [address]      X-Rays:DG Pelvis Portable  Result Date: 04/08/2021 CLINICAL DATA:  Postop right total hip replacement. EXAM: PORTABLE PELVIS 1-2 VIEWS COMPARISON:  12/23/2020 FINDINGS: Portable AP view of the pelvis shows the patient to be status post right total hip replacement. No evidence for immediate hardware complication. No suspicious lytic or sclerotic osseous abnormality. IMPRESSION: Right total hip replacement without evidence for immediate hardware complication. Electronically Signed   By: Misty Stanley M.D.   On: 04/08/2021 11:15   DG C-Arm 1-60 Min-No Report  Result Date: 04/08/2021 Fluoroscopy was utilized by the requesting physician.  No radiographic interpretation.   DG HIP OPERATIVE UNILAT W OR W/O PELVIS RIGHT  Result Date: 04/08/2021 CLINICAL DATA:  Right hip replacement EXAM: OPERATIVE RIGHT HIP WITH PELVIS COMPARISON:  12/23/2020 FLUOROSCOPY TIME:  Radiation Exposure Index (as provided by the fluoroscopic device): 2.37 mGy If the device does not provide the exposure index: Fluoroscopy Time:  12 seconds Number of Acquired Images:  3 FINDINGS: Right hip prosthesis is noted in satisfactory position. No soft tissue or bony abnormality is noted. IMPRESSION: Status post right hip replacement. Electronically Signed   By: Inez Catalina M.D.   On: 04/08/2021 11:31    EKG: Orders placed or performed during the hospital encounter of 03/28/21   EKG 12 lead per protocol  EKG 12 lead per  protocol     Hospital Course: AYLAN PARTINGTON is a 60 y.o. who was admitted to [facility]. They were brought to the operating room on 04/08/2021 and underwent Procedure(s): [place].  Patient tolerated the procedure well and was later transferred to the recovery room and then to the orthopaedic floor for postoperative care. They were given PO and IV analgesics for pain control following their surgery. They were given 24 hours of postoperative antibiotics of  Anti-infectives (From admission, onward)    Start     Dose/Rate Route Frequency Ordered Stop   04/08/21 1645  ceFAZolin (ANCEF) IVPB 2g/100 mL premix        2 g 200 mL/hr over 30 Minutes Intravenous Every 6 hours 04/08/21 1557 04/08/21 2304   04/08/21 0615  ceFAZolin (ANCEF) IVPB 2g/100 mL premix        2 g 200 mL/hr over 30 Minutes Intravenous On call to O.R. 04/08/21 [id number] 04/08/21 [id number]      and started on DVT prophylaxis in the form of Aspirin.   PT and OT were ordered for total joint protocol. Discharge planning consulted to help with postop disposition and equipment needs.  Patient had a good night on the evening of surgery. They started to get up OOB with therapy on POD #0. Pt was seen during rounds and was ready to go home pending progress with therapy.He worked with therapy on POD #1 and was meeting his goals. Pt was discharged to home later that day in stable condition.  Diet: Regular diet Activity: WBAT Follow-up: in 2 weeks Disposition: Home Discharged Condition: good   Discharge Instructions     Call MD / Call 911   Complete by: As directed    If you experience chest pain or shortness of breath, CALL 911 and be transported to the hospital emergency room.  If you develope a fever above 101 F, pus (white drainage) or increased drainage or redness at the wound, or calf pain, call your surgeon's office.   Change dressing   Complete by: As directed    Maintain surgical dressing  until follow up in the clinic. If the edges start to pull up, may reinforce with tape. If the dressing is no longer working, may remove and cover with gauze and tape, but must keep the area dry and clean.  Call with any questions or concerns.   Constipation Prevention   Complete by: As directed    Drink plenty of fluids.  Prune juice may be helpful.  You may use a stool softener, such as Colace (over the counter) 100 mg twice a day.  Use MiraLax (over the counter) for constipation as needed.   Diet - low sodium heart healthy   Complete by: As directed    Increase activity slowly as tolerated   Complete by: As directed    Weight bearing as tolerated with assist device (walker, cane, etc) as directed, use it as long as suggested by your surgeon or therapist, typically at least 4-6 weeks.   Post-operative opioid taper instructions:   Complete by: As directed    POST-OPERATIVE OPIOID TAPER INSTRUCTIONS: It is important to wean off of your opioid medication as soon as possible. If you do not need pain medication after your surgery it is ok to stop day one. Opioids include: Codeine, Hydrocodone(Norco, Vicodin), Oxycodone(Percocet, oxycontin) and hydromorphone amongst others.  Long term and even short term use of opiods can cause: Increased  pain response Dependence Constipation Depression Respiratory depression And more.  Withdrawal symptoms can include Flu like symptoms Nausea, vomiting And more Techniques to manage these symptoms Hydrate well Eat regular healthy meals Stay active Use relaxation techniques(deep breathing, meditating, yoga) Do Not substitute Alcohol to help with tapering If you have been on opioids for less than two weeks and do not have pain than it is ok to stop all together.  Plan to wean off of opioids This plan should start within one week post op of your joint replacement. Maintain the same interval or time between taking each dose and first decrease the dose.  Cut  the total daily intake of opioids by one tablet each day Next start to increase the time between doses. The last dose that should be eliminated is the evening dose.      TED hose   Complete by: As directed    Use stockings (TED hose) for 2 weeks on both leg(s).  You may remove them at night for sleeping.      Allergies as of 04/09/2021       Reactions   Other    BLOOD PRODUCT REFUSAL         Medication List     STOP taking these medications    HYDROcodone-acetaminophen 5-325 MG tablet Commonly known as: Norco Replaced by: HYDROcodone-acetaminophen 7.5-325 MG tablet       TAKE these medications    aspirin 81 MG chewable tablet Chew 1 tablet (81 mg total) by mouth 2 (two) times daily for 28 days.   atorvastatin 20 MG tablet Commonly known as: LIPITOR Take 20 mg by mouth every evening.   Canagliflozin-metFORMIN HCl 50-1000 MG Tabs Take 1 tablet by mouth 2 (two) times daily at 8 am and 10 pm.   Dexilant 60 MG capsule Generic drug: dexlansoprazole Take 60 mg by mouth daily.   docusate sodium 100 MG capsule Commonly known as: COLACE Take 1 capsule (100 mg total) by mouth 2 (two) times daily.   HYDROcodone-acetaminophen 7.5-325 MG tablet Commonly known as: NORCO Take 1-2 tablets by mouth every 6 (six) hours as needed for severe pain. Replaces: HYDROcodone-acetaminophen 5-325 MG tablet   insulin glargine 100 UNIT/ML injection Commonly known as: LANTUS Inject 36 Units into the skin daily. Pt uses at 12 noon every day   losartan-hydrochlorothiazide 100-25 MG tablet Commonly known as: HYZAAR Take 1 tablet by mouth daily. Pt takes in the afternoon   methocarbamol 500 MG tablet Commonly known as: ROBAXIN Take 1 tablet (500 mg total) by mouth every 6 (six) hours as needed for muscle spasms.   omeprazole 20 MG capsule Commonly known as: PRILOSEC Take 20 mg by mouth daily.   Ozempic (0.25 or 0.5 MG/DOSE) 2 MG/1.5ML Sopn Generic drug: Semaglutide(0.25 or  0.'5MG'$ /DOS) INJECT 0.5 MG INTO THE SKIN ONCE A WEEK. What changed:  how much to take how to take this when to take this additional instructions   polyethylene glycol 17 g packet Commonly known as: MIRALAX / GLYCOLAX Take 17 g by mouth daily as needed for mild constipation.               Discharge Care Instructions  (From admission, onward)           Start     Ordered   04/09/21 0000  Change dressing       Comments: Maintain surgical dressing until follow up in the clinic. If the edges start to pull up, may reinforce with tape. If  the dressing is no longer working, may remove and cover with gauze and tape, but must keep the area dry and clean.  Call with any questions or concerns.   04/09/21 [id number]            Follow-up Information     Paralee Cancel, MD. Go on 04/24/2021.   Specialty: Orthopedic Surgery Why: You are scheduled for first post op appointment on Thursday September 15th at 2:45pm. Contact information: [address] [place] [address] [place] [address] [id number]                 Signed: Griffith Citron, PA-C Orthopedic Surgery 04/10/2021, 2:27 PM

## 2021-04-12 NOTE — Hospital Discharge Follow-Up (Signed)
Received notification from laboratory that A1c that had been ordered and drawn on 04/08/21 was unable to be resulted. MD will need to order and redraw if lab result is wanted outpatient.

## 2021-05-21 DIAGNOSIS — M25551 Pain in right hip: Secondary | ICD-10-CM | POA: Diagnosis not present

## 2021-06-23 DIAGNOSIS — E663 Overweight: Secondary | ICD-10-CM | POA: Diagnosis not present

## 2021-06-23 DIAGNOSIS — E1165 Type 2 diabetes mellitus with hyperglycemia: Secondary | ICD-10-CM | POA: Diagnosis not present

## 2021-06-23 DIAGNOSIS — E782 Mixed hyperlipidemia: Secondary | ICD-10-CM | POA: Diagnosis not present

## 2021-06-25 DIAGNOSIS — E1165 Type 2 diabetes mellitus with hyperglycemia: Secondary | ICD-10-CM | POA: Diagnosis not present

## 2021-06-25 DIAGNOSIS — M1990 Unspecified osteoarthritis, unspecified site: Secondary | ICD-10-CM | POA: Diagnosis not present

## 2021-06-25 DIAGNOSIS — I10 Essential (primary) hypertension: Secondary | ICD-10-CM | POA: Diagnosis not present

## 2021-06-25 DIAGNOSIS — Z7185 Encounter for immunization safety counseling: Secondary | ICD-10-CM | POA: Diagnosis not present

## 2021-06-25 DIAGNOSIS — E782 Mixed hyperlipidemia: Secondary | ICD-10-CM | POA: Diagnosis not present

## 2021-06-25 DIAGNOSIS — Z23 Encounter for immunization: Secondary | ICD-10-CM | POA: Diagnosis not present

## 2021-06-28 DIAGNOSIS — E1165 Type 2 diabetes mellitus with hyperglycemia: Secondary | ICD-10-CM | POA: Diagnosis not present

## 2021-07-02 DIAGNOSIS — Z96641 Presence of right artificial hip joint: Secondary | ICD-10-CM | POA: Diagnosis not present

## 2021-07-02 DIAGNOSIS — Z471 Aftercare following joint replacement surgery: Secondary | ICD-10-CM | POA: Diagnosis not present

## 2021-07-21 DIAGNOSIS — Z87891 Personal history of nicotine dependence: Secondary | ICD-10-CM | POA: Diagnosis not present

## 2021-07-21 DIAGNOSIS — Z1331 Encounter for screening for depression: Secondary | ICD-10-CM | POA: Diagnosis not present

## 2021-07-21 DIAGNOSIS — Z1339 Encounter for screening examination for other mental health and behavioral disorders: Secondary | ICD-10-CM | POA: Diagnosis not present

## 2021-07-21 DIAGNOSIS — Z0000 Encounter for general adult medical examination without abnormal findings: Secondary | ICD-10-CM | POA: Diagnosis not present

## 2021-07-25 ENCOUNTER — Other Ambulatory Visit: Payer: Self-pay | Admitting: Family Medicine

## 2021-07-25 DIAGNOSIS — Z87891 Personal history of nicotine dependence: Secondary | ICD-10-CM

## 2021-08-20 DIAGNOSIS — Z1211 Encounter for screening for malignant neoplasm of colon: Secondary | ICD-10-CM | POA: Diagnosis not present

## 2021-08-27 ENCOUNTER — Ambulatory Visit
Admission: RE | Admit: 2021-08-27 | Discharge: 2021-08-27 | Disposition: A | Discharge status: Home / Self Care | Payer: Medicare HMO | Source: Ambulatory Visit | Attending: Family Medicine | Admitting: Family Medicine

## 2021-08-27 ENCOUNTER — Ambulatory Visit: Payer: Medicare HMO

## 2021-08-27 DIAGNOSIS — Z87891 Personal history of nicotine dependence: Secondary | ICD-10-CM | POA: Diagnosis not present

## 2021-09-25 DIAGNOSIS — I10 Essential (primary) hypertension: Secondary | ICD-10-CM | POA: Diagnosis not present

## 2021-09-25 DIAGNOSIS — E1165 Type 2 diabetes mellitus with hyperglycemia: Secondary | ICD-10-CM | POA: Diagnosis not present

## 2021-09-25 DIAGNOSIS — J439 Emphysema, unspecified: Secondary | ICD-10-CM | POA: Diagnosis not present

## 2021-09-25 DIAGNOSIS — E11319 Type 2 diabetes mellitus with unspecified diabetic retinopathy without macular edema: Secondary | ICD-10-CM | POA: Diagnosis not present

## 2021-09-25 DIAGNOSIS — R739 Hyperglycemia, unspecified: Secondary | ICD-10-CM | POA: Diagnosis not present

## 2021-10-08 DIAGNOSIS — E1165 Type 2 diabetes mellitus with hyperglycemia: Secondary | ICD-10-CM | POA: Diagnosis not present

## 2021-11-03 DIAGNOSIS — L309 Dermatitis, unspecified: Secondary | ICD-10-CM | POA: Diagnosis not present

## 2021-11-03 DIAGNOSIS — M170 Bilateral primary osteoarthritis of knee: Secondary | ICD-10-CM | POA: Diagnosis not present

## 2021-11-03 DIAGNOSIS — M1711 Unilateral primary osteoarthritis, right knee: Secondary | ICD-10-CM | POA: Diagnosis not present

## 2021-11-04 ENCOUNTER — Ambulatory Visit
Admission: RE | Admit: 2021-11-04 | Discharge: 2021-11-04 | Disposition: A | Discharge status: Home / Self Care | Payer: Medicare HMO | Source: Ambulatory Visit | Attending: Family Medicine | Admitting: Family Medicine

## 2021-11-04 ENCOUNTER — Other Ambulatory Visit: Payer: Self-pay | Admitting: Family Medicine

## 2021-11-04 DIAGNOSIS — M25562 Pain in left knee: Secondary | ICD-10-CM | POA: Diagnosis not present

## 2021-11-04 DIAGNOSIS — M25561 Pain in right knee: Secondary | ICD-10-CM | POA: Diagnosis not present

## 2021-11-04 DIAGNOSIS — M170 Bilateral primary osteoarthritis of knee: Secondary | ICD-10-CM

## 2021-12-16 DIAGNOSIS — D169 Benign neoplasm of bone and articular cartilage, unspecified: Secondary | ICD-10-CM | POA: Diagnosis not present

## 2021-12-16 DIAGNOSIS — I10 Essential (primary) hypertension: Secondary | ICD-10-CM | POA: Diagnosis not present

## 2021-12-16 DIAGNOSIS — E669 Obesity, unspecified: Secondary | ICD-10-CM | POA: Diagnosis not present

## 2021-12-22 DIAGNOSIS — E559 Vitamin D deficiency, unspecified: Secondary | ICD-10-CM | POA: Diagnosis not present

## 2021-12-22 DIAGNOSIS — E1165 Type 2 diabetes mellitus with hyperglycemia: Secondary | ICD-10-CM | POA: Diagnosis not present

## 2021-12-25 DIAGNOSIS — E1165 Type 2 diabetes mellitus with hyperglycemia: Secondary | ICD-10-CM | POA: Diagnosis not present

## 2021-12-25 DIAGNOSIS — I10 Essential (primary) hypertension: Secondary | ICD-10-CM | POA: Diagnosis not present

## 2021-12-25 DIAGNOSIS — Z23 Encounter for immunization: Secondary | ICD-10-CM | POA: Diagnosis not present

## 2021-12-25 DIAGNOSIS — E559 Vitamin D deficiency, unspecified: Secondary | ICD-10-CM | POA: Diagnosis not present

## 2022-01-29 DIAGNOSIS — M25561 Pain in right knee: Secondary | ICD-10-CM | POA: Diagnosis not present

## 2022-01-29 DIAGNOSIS — M1711 Unilateral primary osteoarthritis, right knee: Secondary | ICD-10-CM | POA: Diagnosis not present

## 2022-02-11 DIAGNOSIS — E119 Type 2 diabetes mellitus without complications: Secondary | ICD-10-CM | POA: Diagnosis not present

## 2022-02-11 DIAGNOSIS — H5213 Myopia, bilateral: Secondary | ICD-10-CM | POA: Diagnosis not present

## 2022-02-12 DIAGNOSIS — M25561 Pain in right knee: Secondary | ICD-10-CM | POA: Diagnosis not present

## 2022-03-02 DIAGNOSIS — E1165 Type 2 diabetes mellitus with hyperglycemia: Secondary | ICD-10-CM | POA: Diagnosis not present

## 2022-03-19 DIAGNOSIS — M25561 Pain in right knee: Secondary | ICD-10-CM | POA: Diagnosis not present

## 2022-03-19 DIAGNOSIS — M7651 Patellar tendinitis, right knee: Secondary | ICD-10-CM | POA: Diagnosis not present

## 2022-03-19 DIAGNOSIS — Z96641 Presence of right artificial hip joint: Secondary | ICD-10-CM | POA: Diagnosis not present

## 2022-04-01 DIAGNOSIS — E782 Mixed hyperlipidemia: Secondary | ICD-10-CM | POA: Diagnosis not present

## 2022-04-01 DIAGNOSIS — I10 Essential (primary) hypertension: Secondary | ICD-10-CM | POA: Diagnosis not present

## 2022-04-01 DIAGNOSIS — E1165 Type 2 diabetes mellitus with hyperglycemia: Secondary | ICD-10-CM | POA: Diagnosis not present

## 2022-04-01 DIAGNOSIS — E559 Vitamin D deficiency, unspecified: Secondary | ICD-10-CM | POA: Diagnosis not present

## 2022-04-06 DIAGNOSIS — E782 Mixed hyperlipidemia: Secondary | ICD-10-CM | POA: Diagnosis not present

## 2022-04-06 DIAGNOSIS — E1122 Type 2 diabetes mellitus with diabetic chronic kidney disease: Secondary | ICD-10-CM | POA: Diagnosis not present

## 2022-04-06 DIAGNOSIS — E1165 Type 2 diabetes mellitus with hyperglycemia: Secondary | ICD-10-CM | POA: Diagnosis not present

## 2022-04-06 DIAGNOSIS — M25569 Pain in unspecified knee: Secondary | ICD-10-CM | POA: Diagnosis not present

## 2022-04-06 DIAGNOSIS — E559 Vitamin D deficiency, unspecified: Secondary | ICD-10-CM | POA: Diagnosis not present

## 2022-04-06 DIAGNOSIS — I10 Essential (primary) hypertension: Secondary | ICD-10-CM | POA: Diagnosis not present

## 2022-04-06 DIAGNOSIS — I129 Hypertensive chronic kidney disease with stage 1 through stage 4 chronic kidney disease, or unspecified chronic kidney disease: Secondary | ICD-10-CM | POA: Diagnosis not present

## 2022-04-06 DIAGNOSIS — Z23 Encounter for immunization: Secondary | ICD-10-CM | POA: Diagnosis not present

## 2022-05-05 IMAGING — DX DG KNEE 1-2V*L*
2 series · 2 of 2 positions shown · non-contrast
Comparison: March 27, 2010.

CLINICAL DATA: Bilateral knee pain without known injury.

EXAM:
LEFT KNEE - 1-2 VIEW

[dg knee 3 views left (1 of 2)]
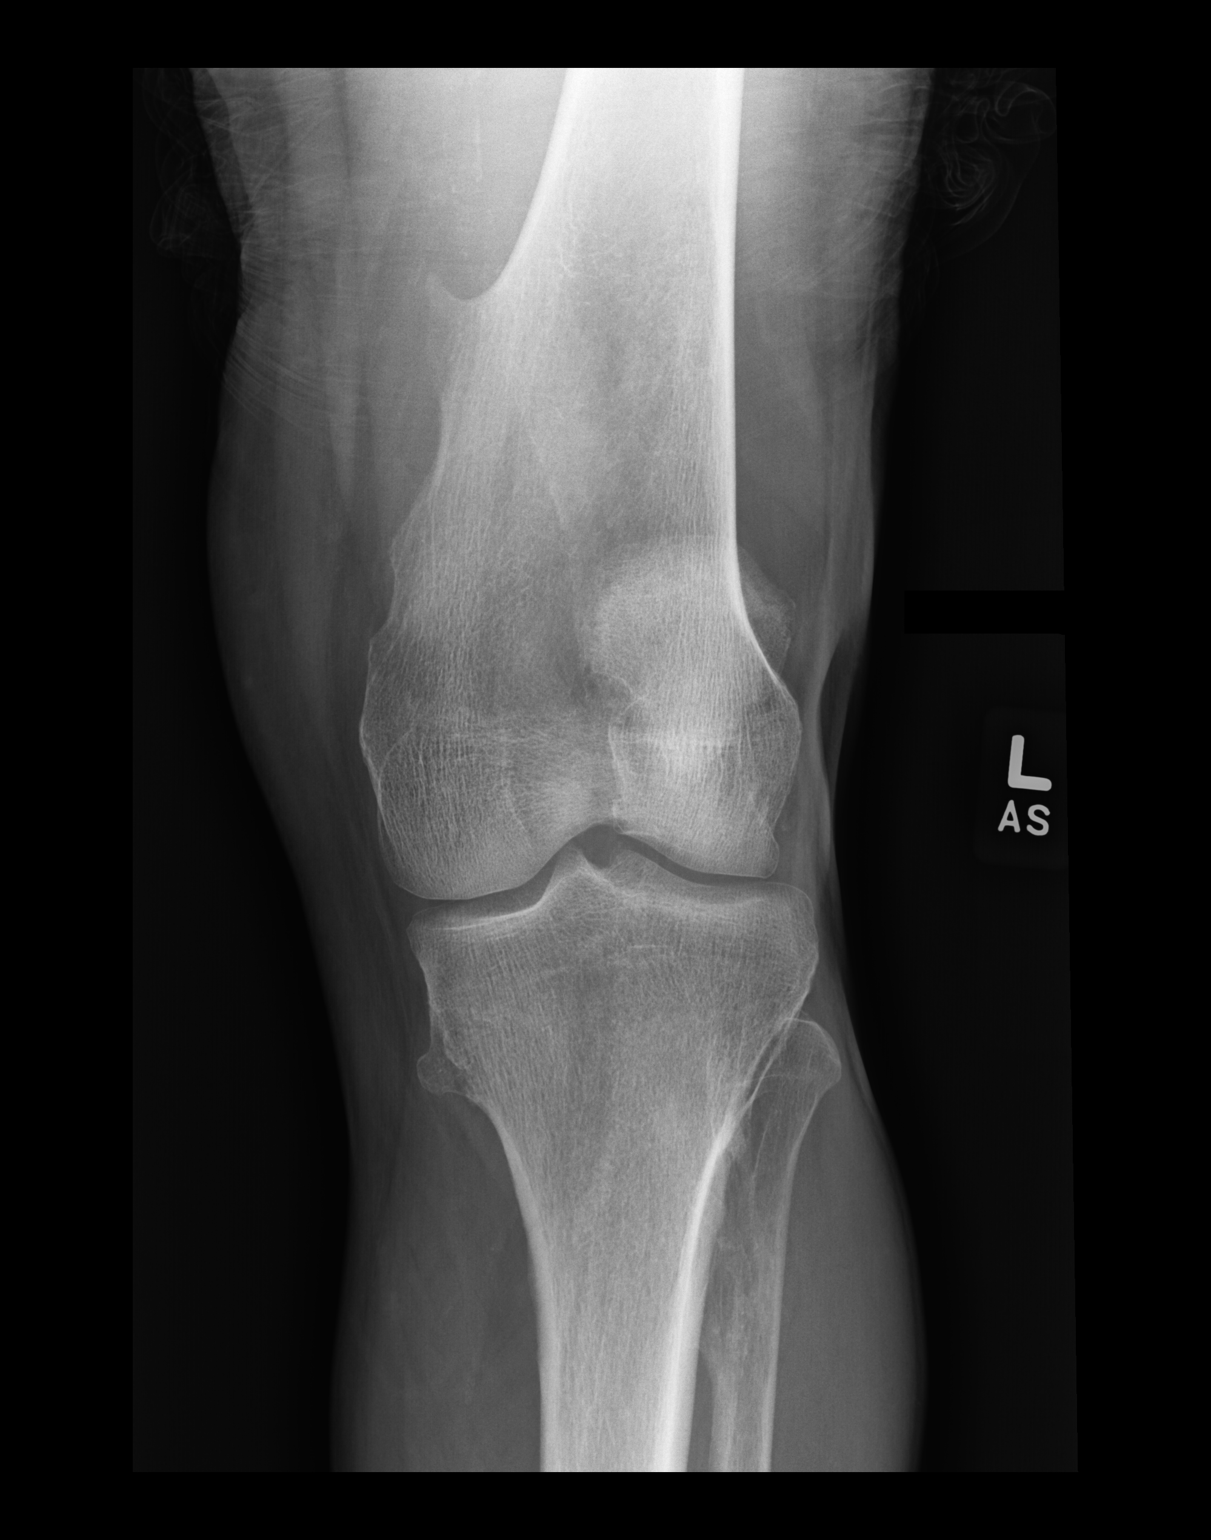

[dg knee 3 views left (2 of 2)]
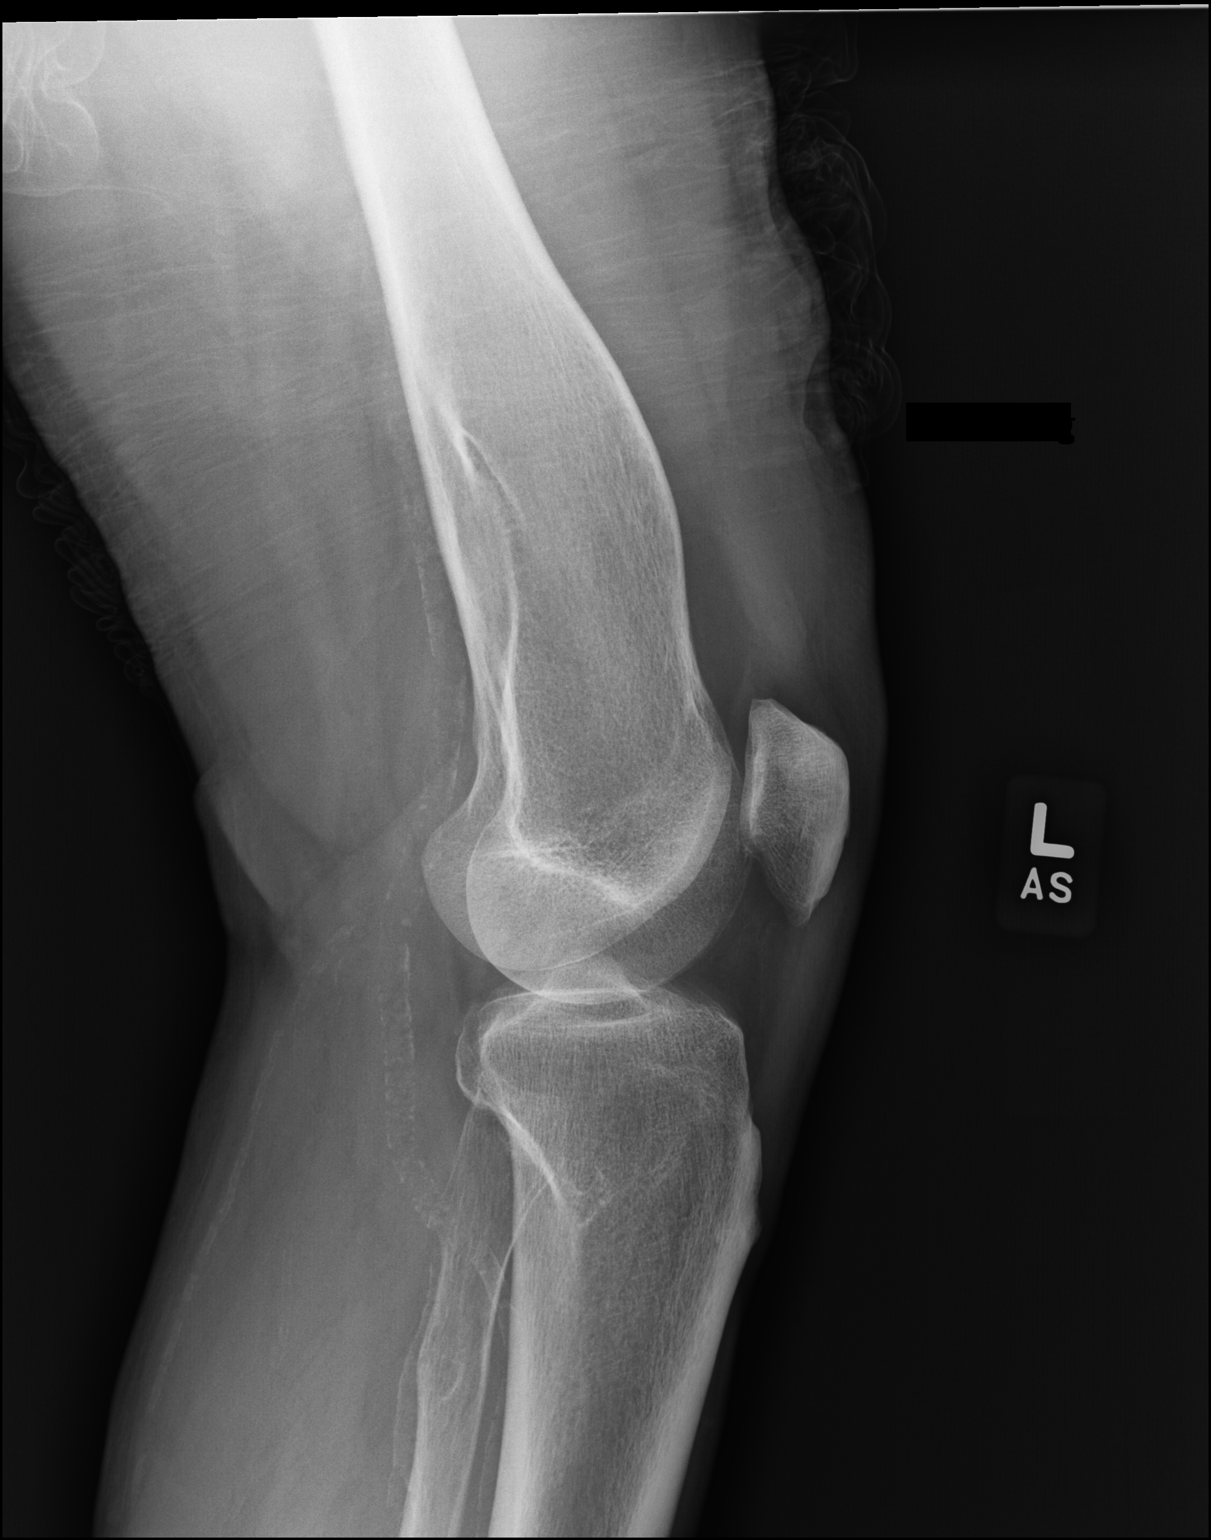

[2 of 2 positions shown; findings below may reference images not displayed]

FINDINGS: No acute fracture or dislocation is noted. Vascular calcifications
are noted. No joint effusion is noted. Joint spaces are
unremarkable. There are again noted multiple osteo chondroma is
involving the distal femur and proximal tibia as noted on prior MRI.
Broadening of the diaphysis of the distal left femur is again noted
which may be related to osteochondromas.
IMPRESSION: Multiple osteochondromas are noted as described on prior MRI. No
definite acute fracture or dislocation is noted.

## 2022-05-21 DIAGNOSIS — E1165 Type 2 diabetes mellitus with hyperglycemia: Secondary | ICD-10-CM | POA: Diagnosis not present

## 2022-06-30 DIAGNOSIS — E782 Mixed hyperlipidemia: Secondary | ICD-10-CM | POA: Diagnosis not present

## 2022-06-30 DIAGNOSIS — E1165 Type 2 diabetes mellitus with hyperglycemia: Secondary | ICD-10-CM | POA: Diagnosis not present

## 2022-07-08 DIAGNOSIS — E1165 Type 2 diabetes mellitus with hyperglycemia: Secondary | ICD-10-CM | POA: Diagnosis not present

## 2022-07-08 DIAGNOSIS — E782 Mixed hyperlipidemia: Secondary | ICD-10-CM | POA: Diagnosis not present

## 2022-07-08 DIAGNOSIS — I10 Essential (primary) hypertension: Secondary | ICD-10-CM | POA: Diagnosis not present

## 2022-07-08 DIAGNOSIS — Z6832 Body mass index (BMI) 32.0-32.9, adult: Secondary | ICD-10-CM | POA: Diagnosis not present

## 2022-07-08 DIAGNOSIS — Z23 Encounter for immunization: Secondary | ICD-10-CM | POA: Diagnosis not present

## 2022-07-08 DIAGNOSIS — E1121 Type 2 diabetes mellitus with diabetic nephropathy: Secondary | ICD-10-CM | POA: Diagnosis not present

## 2022-07-22 ENCOUNTER — Other Ambulatory Visit: Payer: Self-pay | Admitting: Nurse Practitioner

## 2022-07-22 DIAGNOSIS — Z122 Encounter for screening for malignant neoplasm of respiratory organs: Secondary | ICD-10-CM

## 2022-07-22 DIAGNOSIS — Z1331 Encounter for screening for depression: Secondary | ICD-10-CM | POA: Diagnosis not present

## 2022-07-22 DIAGNOSIS — Z0000 Encounter for general adult medical examination without abnormal findings: Secondary | ICD-10-CM | POA: Diagnosis not present

## 2022-07-22 DIAGNOSIS — Z1339 Encounter for screening examination for other mental health and behavioral disorders: Secondary | ICD-10-CM | POA: Diagnosis not present

## 2022-07-22 DIAGNOSIS — J439 Emphysema, unspecified: Secondary | ICD-10-CM | POA: Diagnosis not present

## 2022-08-06 ENCOUNTER — Other Ambulatory Visit: Payer: Medicare HMO

## 2022-08-13 DIAGNOSIS — E1165 Type 2 diabetes mellitus with hyperglycemia: Secondary | ICD-10-CM | POA: Diagnosis not present

## 2022-08-13 DIAGNOSIS — M170 Bilateral primary osteoarthritis of knee: Secondary | ICD-10-CM | POA: Diagnosis not present

## 2022-08-13 DIAGNOSIS — M160 Bilateral primary osteoarthritis of hip: Secondary | ICD-10-CM | POA: Diagnosis not present

## 2022-08-13 DIAGNOSIS — I10 Essential (primary) hypertension: Secondary | ICD-10-CM | POA: Diagnosis not present

## 2022-08-26 DIAGNOSIS — E1165 Type 2 diabetes mellitus with hyperglycemia: Secondary | ICD-10-CM | POA: Diagnosis not present

## 2022-09-03 ENCOUNTER — Ambulatory Visit
Admission: RE | Admit: 2022-09-03 | Discharge: 2022-09-03 | Disposition: A | Discharge status: Home / Self Care | Payer: Medicare HMO | Source: Ambulatory Visit | Attending: Nurse Practitioner | Admitting: Nurse Practitioner

## 2022-09-03 DIAGNOSIS — Z87891 Personal history of nicotine dependence: Secondary | ICD-10-CM | POA: Diagnosis not present

## 2022-09-03 DIAGNOSIS — Z122 Encounter for screening for malignant neoplasm of respiratory organs: Secondary | ICD-10-CM

## 2022-09-14 DIAGNOSIS — N12 Tubulo-interstitial nephritis, not specified as acute or chronic: Secondary | ICD-10-CM | POA: Diagnosis not present

## 2022-09-14 DIAGNOSIS — N3000 Acute cystitis without hematuria: Secondary | ICD-10-CM | POA: Diagnosis not present

## 2022-09-14 DIAGNOSIS — E1165 Type 2 diabetes mellitus with hyperglycemia: Secondary | ICD-10-CM | POA: Diagnosis not present

## 2022-09-14 DIAGNOSIS — R3589 Other polyuria: Secondary | ICD-10-CM | POA: Diagnosis not present

## 2022-09-14 DIAGNOSIS — I10 Essential (primary) hypertension: Secondary | ICD-10-CM | POA: Diagnosis not present

## 2022-09-16 DIAGNOSIS — N200 Calculus of kidney: Secondary | ICD-10-CM | POA: Diagnosis not present

## 2022-09-16 DIAGNOSIS — N12 Tubulo-interstitial nephritis, not specified as acute or chronic: Secondary | ICD-10-CM | POA: Diagnosis not present

## 2022-09-16 DIAGNOSIS — K219 Gastro-esophageal reflux disease without esophagitis: Secondary | ICD-10-CM | POA: Diagnosis not present

## 2022-09-16 DIAGNOSIS — N3000 Acute cystitis without hematuria: Secondary | ICD-10-CM | POA: Diagnosis not present

## 2022-09-16 DIAGNOSIS — R1011 Right upper quadrant pain: Secondary | ICD-10-CM | POA: Diagnosis not present

## 2022-09-17 ENCOUNTER — Ambulatory Visit
Admission: RE | Admit: 2022-09-17 | Discharge: 2022-09-17 | Disposition: A | Discharge status: Home / Self Care | Payer: Medicare HMO | Source: Ambulatory Visit | Attending: Family Medicine | Admitting: Family Medicine

## 2022-09-17 ENCOUNTER — Other Ambulatory Visit: Payer: Self-pay | Admitting: Family Medicine

## 2022-09-17 DIAGNOSIS — T189XXA Foreign body of alimentary tract, part unspecified, initial encounter: Secondary | ICD-10-CM | POA: Diagnosis not present

## 2022-09-17 DIAGNOSIS — R1011 Right upper quadrant pain: Secondary | ICD-10-CM | POA: Diagnosis not present

## 2022-09-17 DIAGNOSIS — K769 Liver disease, unspecified: Secondary | ICD-10-CM | POA: Diagnosis not present

## 2022-09-17 DIAGNOSIS — R109 Unspecified abdominal pain: Secondary | ICD-10-CM | POA: Diagnosis not present

## 2022-09-17 DIAGNOSIS — K5730 Diverticulosis of large intestine without perforation or abscess without bleeding: Secondary | ICD-10-CM | POA: Diagnosis not present

## 2022-09-17 DIAGNOSIS — N12 Tubulo-interstitial nephritis, not specified as acute or chronic: Secondary | ICD-10-CM | POA: Diagnosis not present

## 2022-09-17 DIAGNOSIS — R7989 Other specified abnormal findings of blood chemistry: Secondary | ICD-10-CM | POA: Diagnosis not present

## 2022-09-21 ENCOUNTER — Other Ambulatory Visit: Payer: Self-pay | Admitting: Family Medicine

## 2022-09-21 ENCOUNTER — Ambulatory Visit
Admission: RE | Admit: 2022-09-21 | Discharge: 2022-09-21 | Disposition: A | Discharge status: Home / Self Care | Payer: Medicare HMO | Source: Ambulatory Visit | Attending: Family Medicine | Admitting: Family Medicine

## 2022-09-21 DIAGNOSIS — S30851A Superficial foreign body of abdominal wall, initial encounter: Secondary | ICD-10-CM

## 2022-09-21 DIAGNOSIS — N12 Tubulo-interstitial nephritis, not specified as acute or chronic: Secondary | ICD-10-CM | POA: Diagnosis not present

## 2022-09-21 DIAGNOSIS — R109 Unspecified abdominal pain: Secondary | ICD-10-CM | POA: Diagnosis not present

## 2022-09-21 DIAGNOSIS — R7989 Other specified abnormal findings of blood chemistry: Secondary | ICD-10-CM | POA: Diagnosis not present

## 2022-09-22 DIAGNOSIS — F411 Generalized anxiety disorder: Secondary | ICD-10-CM | POA: Diagnosis not present

## 2022-09-22 DIAGNOSIS — J439 Emphysema, unspecified: Secondary | ICD-10-CM | POA: Diagnosis not present

## 2022-09-22 DIAGNOSIS — F331 Major depressive disorder, recurrent, moderate: Secondary | ICD-10-CM | POA: Diagnosis not present

## 2022-09-22 DIAGNOSIS — G4700 Insomnia, unspecified: Secondary | ICD-10-CM | POA: Diagnosis not present

## 2022-09-22 DIAGNOSIS — K5790 Diverticulosis of intestine, part unspecified, without perforation or abscess without bleeding: Secondary | ICD-10-CM | POA: Diagnosis not present

## 2022-09-22 DIAGNOSIS — I700 Atherosclerosis of aorta: Secondary | ICD-10-CM | POA: Diagnosis not present

## 2022-10-08 DIAGNOSIS — E11319 Type 2 diabetes mellitus with unspecified diabetic retinopathy without macular edema: Secondary | ICD-10-CM | POA: Diagnosis not present

## 2022-10-08 DIAGNOSIS — E559 Vitamin D deficiency, unspecified: Secondary | ICD-10-CM | POA: Diagnosis not present

## 2022-10-08 DIAGNOSIS — I10 Essential (primary) hypertension: Secondary | ICD-10-CM | POA: Diagnosis not present

## 2022-10-08 DIAGNOSIS — E782 Mixed hyperlipidemia: Secondary | ICD-10-CM | POA: Diagnosis not present

## 2022-10-15 DIAGNOSIS — R7989 Other specified abnormal findings of blood chemistry: Secondary | ICD-10-CM | POA: Diagnosis not present

## 2022-10-15 DIAGNOSIS — E1122 Type 2 diabetes mellitus with diabetic chronic kidney disease: Secondary | ICD-10-CM | POA: Diagnosis not present

## 2022-10-15 DIAGNOSIS — E559 Vitamin D deficiency, unspecified: Secondary | ICD-10-CM | POA: Diagnosis not present

## 2022-10-15 DIAGNOSIS — E1121 Type 2 diabetes mellitus with diabetic nephropathy: Secondary | ICD-10-CM | POA: Diagnosis not present

## 2022-10-15 DIAGNOSIS — I129 Hypertensive chronic kidney disease with stage 1 through stage 4 chronic kidney disease, or unspecified chronic kidney disease: Secondary | ICD-10-CM | POA: Diagnosis not present

## 2022-10-15 DIAGNOSIS — N50819 Testicular pain, unspecified: Secondary | ICD-10-CM | POA: Diagnosis not present

## 2022-10-15 DIAGNOSIS — E1165 Type 2 diabetes mellitus with hyperglycemia: Secondary | ICD-10-CM | POA: Diagnosis not present

## 2022-10-15 DIAGNOSIS — E782 Mixed hyperlipidemia: Secondary | ICD-10-CM | POA: Diagnosis not present

## 2022-10-27 DIAGNOSIS — Z0100 Encounter for examination of eyes and vision without abnormal findings: Secondary | ICD-10-CM | POA: Diagnosis not present

## 2022-11-12 DIAGNOSIS — T183XXA Foreign body in small intestine, initial encounter: Secondary | ICD-10-CM | POA: Diagnosis not present

## 2022-11-24 DIAGNOSIS — E1165 Type 2 diabetes mellitus with hyperglycemia: Secondary | ICD-10-CM | POA: Diagnosis not present

## 2023-01-15 DIAGNOSIS — E1165 Type 2 diabetes mellitus with hyperglycemia: Secondary | ICD-10-CM | POA: Diagnosis not present

## 2023-01-15 DIAGNOSIS — E782 Mixed hyperlipidemia: Secondary | ICD-10-CM | POA: Diagnosis not present

## 2023-01-15 DIAGNOSIS — I10 Essential (primary) hypertension: Secondary | ICD-10-CM | POA: Diagnosis not present

## 2023-01-21 DIAGNOSIS — Z789 Other specified health status: Secondary | ICD-10-CM | POA: Diagnosis not present

## 2023-01-21 DIAGNOSIS — I10 Essential (primary) hypertension: Secondary | ICD-10-CM | POA: Diagnosis not present

## 2023-01-21 DIAGNOSIS — E782 Mixed hyperlipidemia: Secondary | ICD-10-CM | POA: Diagnosis not present

## 2023-01-21 DIAGNOSIS — M545 Low back pain, unspecified: Secondary | ICD-10-CM | POA: Diagnosis not present

## 2023-01-21 DIAGNOSIS — E669 Obesity, unspecified: Secondary | ICD-10-CM | POA: Diagnosis not present

## 2023-01-21 DIAGNOSIS — E1165 Type 2 diabetes mellitus with hyperglycemia: Secondary | ICD-10-CM | POA: Diagnosis not present

## 2023-01-21 DIAGNOSIS — R7989 Other specified abnormal findings of blood chemistry: Secondary | ICD-10-CM | POA: Diagnosis not present

## 2023-04-22 DIAGNOSIS — R5383 Other fatigue: Secondary | ICD-10-CM | POA: Diagnosis not present

## 2023-04-22 DIAGNOSIS — R739 Hyperglycemia, unspecified: Secondary | ICD-10-CM | POA: Diagnosis not present

## 2023-04-22 DIAGNOSIS — I10 Essential (primary) hypertension: Secondary | ICD-10-CM | POA: Diagnosis not present

## 2023-04-22 DIAGNOSIS — E11319 Type 2 diabetes mellitus with unspecified diabetic retinopathy without macular edema: Secondary | ICD-10-CM | POA: Diagnosis not present

## 2023-04-22 DIAGNOSIS — E559 Vitamin D deficiency, unspecified: Secondary | ICD-10-CM | POA: Diagnosis not present

## 2023-04-29 DIAGNOSIS — E1165 Type 2 diabetes mellitus with hyperglycemia: Secondary | ICD-10-CM | POA: Diagnosis not present

## 2023-04-29 DIAGNOSIS — E1169 Type 2 diabetes mellitus with other specified complication: Secondary | ICD-10-CM | POA: Diagnosis not present

## 2023-04-29 DIAGNOSIS — E1159 Type 2 diabetes mellitus with other circulatory complications: Secondary | ICD-10-CM | POA: Diagnosis not present

## 2023-04-29 DIAGNOSIS — M545 Low back pain, unspecified: Secondary | ICD-10-CM | POA: Diagnosis not present

## 2023-04-29 DIAGNOSIS — I152 Hypertension secondary to endocrine disorders: Secondary | ICD-10-CM | POA: Diagnosis not present

## 2023-04-29 DIAGNOSIS — E782 Mixed hyperlipidemia: Secondary | ICD-10-CM | POA: Diagnosis not present

## 2023-04-29 DIAGNOSIS — E559 Vitamin D deficiency, unspecified: Secondary | ICD-10-CM | POA: Diagnosis not present

## 2023-04-29 DIAGNOSIS — R7989 Other specified abnormal findings of blood chemistry: Secondary | ICD-10-CM | POA: Diagnosis not present

## 2023-05-13 DIAGNOSIS — Z125 Encounter for screening for malignant neoplasm of prostate: Secondary | ICD-10-CM | POA: Diagnosis not present

## 2023-05-13 DIAGNOSIS — K4090 Unilateral inguinal hernia, without obstruction or gangrene, not specified as recurrent: Secondary | ICD-10-CM | POA: Diagnosis not present

## 2023-05-13 DIAGNOSIS — N5201 Erectile dysfunction due to arterial insufficiency: Secondary | ICD-10-CM | POA: Diagnosis not present

## 2023-06-10 DIAGNOSIS — E1159 Type 2 diabetes mellitus with other circulatory complications: Secondary | ICD-10-CM | POA: Diagnosis not present

## 2023-06-10 DIAGNOSIS — I152 Hypertension secondary to endocrine disorders: Secondary | ICD-10-CM | POA: Diagnosis not present

## 2023-06-10 DIAGNOSIS — K4090 Unilateral inguinal hernia, without obstruction or gangrene, not specified as recurrent: Secondary | ICD-10-CM | POA: Diagnosis not present

## 2023-06-17 DIAGNOSIS — K4020 Bilateral inguinal hernia, without obstruction or gangrene, not specified as recurrent: Secondary | ICD-10-CM | POA: Diagnosis not present

## 2023-06-17 DIAGNOSIS — K421 Umbilical hernia with gangrene: Secondary | ICD-10-CM | POA: Diagnosis not present

## 2023-06-23 ENCOUNTER — Encounter: Payer: Self-pay | Admitting: Gastroenterology

## 2023-07-05 DIAGNOSIS — E1165 Type 2 diabetes mellitus with hyperglycemia: Secondary | ICD-10-CM | POA: Diagnosis not present

## 2023-07-15 DIAGNOSIS — Z1331 Encounter for screening for depression: Secondary | ICD-10-CM | POA: Diagnosis not present

## 2023-07-15 DIAGNOSIS — Z0000 Encounter for general adult medical examination without abnormal findings: Secondary | ICD-10-CM | POA: Diagnosis not present

## 2023-07-15 DIAGNOSIS — Z1339 Encounter for screening examination for other mental health and behavioral disorders: Secondary | ICD-10-CM | POA: Diagnosis not present

## 2023-07-22 DIAGNOSIS — E785 Hyperlipidemia, unspecified: Secondary | ICD-10-CM | POA: Diagnosis not present

## 2023-07-22 DIAGNOSIS — E1165 Type 2 diabetes mellitus with hyperglycemia: Secondary | ICD-10-CM | POA: Diagnosis not present

## 2023-07-22 DIAGNOSIS — Z1211 Encounter for screening for malignant neoplasm of colon: Secondary | ICD-10-CM | POA: Diagnosis not present

## 2023-07-28 ENCOUNTER — Other Ambulatory Visit: Payer: Self-pay | Admitting: Nurse Practitioner

## 2023-07-28 DIAGNOSIS — F17200 Nicotine dependence, unspecified, uncomplicated: Secondary | ICD-10-CM

## 2023-07-29 ENCOUNTER — Other Ambulatory Visit: Payer: Self-pay | Admitting: Nurse Practitioner

## 2023-07-29 DIAGNOSIS — I129 Hypertensive chronic kidney disease with stage 1 through stage 4 chronic kidney disease, or unspecified chronic kidney disease: Secondary | ICD-10-CM | POA: Diagnosis not present

## 2023-07-29 DIAGNOSIS — I10 Essential (primary) hypertension: Secondary | ICD-10-CM | POA: Diagnosis not present

## 2023-07-29 DIAGNOSIS — R911 Solitary pulmonary nodule: Secondary | ICD-10-CM

## 2023-07-29 DIAGNOSIS — J439 Emphysema, unspecified: Secondary | ICD-10-CM

## 2023-07-29 DIAGNOSIS — E1165 Type 2 diabetes mellitus with hyperglycemia: Secondary | ICD-10-CM | POA: Diagnosis not present

## 2023-07-29 DIAGNOSIS — E1122 Type 2 diabetes mellitus with diabetic chronic kidney disease: Secondary | ICD-10-CM | POA: Diagnosis not present

## 2023-07-29 DIAGNOSIS — E782 Mixed hyperlipidemia: Secondary | ICD-10-CM | POA: Diagnosis not present

## 2023-07-29 DIAGNOSIS — Z789 Other specified health status: Secondary | ICD-10-CM | POA: Diagnosis not present

## 2023-08-12 DIAGNOSIS — E11319 Type 2 diabetes mellitus with unspecified diabetic retinopathy without macular edema: Secondary | ICD-10-CM | POA: Diagnosis not present

## 2023-08-12 DIAGNOSIS — E1165 Type 2 diabetes mellitus with hyperglycemia: Secondary | ICD-10-CM | POA: Diagnosis not present

## 2023-08-12 DIAGNOSIS — K219 Gastro-esophageal reflux disease without esophagitis: Secondary | ICD-10-CM | POA: Diagnosis not present

## 2023-08-12 DIAGNOSIS — J439 Emphysema, unspecified: Secondary | ICD-10-CM | POA: Diagnosis not present

## 2023-08-26 DIAGNOSIS — F331 Major depressive disorder, recurrent, moderate: Secondary | ICD-10-CM | POA: Diagnosis not present

## 2023-08-26 DIAGNOSIS — F411 Generalized anxiety disorder: Secondary | ICD-10-CM | POA: Diagnosis not present

## 2023-08-26 DIAGNOSIS — G4700 Insomnia, unspecified: Secondary | ICD-10-CM | POA: Diagnosis not present

## 2023-08-26 DIAGNOSIS — E1165 Type 2 diabetes mellitus with hyperglycemia: Secondary | ICD-10-CM | POA: Diagnosis not present

## 2023-09-23 DIAGNOSIS — E1165 Type 2 diabetes mellitus with hyperglycemia: Secondary | ICD-10-CM | POA: Diagnosis not present

## 2023-09-23 DIAGNOSIS — F331 Major depressive disorder, recurrent, moderate: Secondary | ICD-10-CM | POA: Diagnosis not present

## 2023-09-23 DIAGNOSIS — F411 Generalized anxiety disorder: Secondary | ICD-10-CM | POA: Diagnosis not present

## 2023-09-23 DIAGNOSIS — S4991XA Unspecified injury of right shoulder and upper arm, initial encounter: Secondary | ICD-10-CM | POA: Diagnosis not present

## 2023-09-23 DIAGNOSIS — G4700 Insomnia, unspecified: Secondary | ICD-10-CM | POA: Diagnosis not present

## 2023-09-24 DIAGNOSIS — M19011 Primary osteoarthritis, right shoulder: Secondary | ICD-10-CM | POA: Diagnosis not present

## 2023-09-29 DIAGNOSIS — M19019 Primary osteoarthritis, unspecified shoulder: Secondary | ICD-10-CM | POA: Diagnosis not present

## 2023-09-29 DIAGNOSIS — M25811 Other specified joint disorders, right shoulder: Secondary | ICD-10-CM | POA: Diagnosis not present

## 2023-09-29 DIAGNOSIS — D169 Benign neoplasm of bone and articular cartilage, unspecified: Secondary | ICD-10-CM | POA: Diagnosis not present

## 2023-09-29 DIAGNOSIS — I10 Essential (primary) hypertension: Secondary | ICD-10-CM | POA: Diagnosis not present

## 2023-10-03 DIAGNOSIS — E1165 Type 2 diabetes mellitus with hyperglycemia: Secondary | ICD-10-CM | POA: Diagnosis not present

## 2023-10-22 DIAGNOSIS — Z13220 Encounter for screening for lipoid disorders: Secondary | ICD-10-CM | POA: Diagnosis not present

## 2023-10-22 DIAGNOSIS — Z0000 Encounter for general adult medical examination without abnormal findings: Secondary | ICD-10-CM | POA: Diagnosis not present

## 2023-10-22 DIAGNOSIS — E782 Mixed hyperlipidemia: Secondary | ICD-10-CM | POA: Diagnosis not present

## 2023-10-22 DIAGNOSIS — Z125 Encounter for screening for malignant neoplasm of prostate: Secondary | ICD-10-CM | POA: Diagnosis not present

## 2023-10-22 DIAGNOSIS — R739 Hyperglycemia, unspecified: Secondary | ICD-10-CM | POA: Diagnosis not present

## 2023-10-29 ENCOUNTER — Other Ambulatory Visit: Payer: Self-pay | Admitting: Physician Assistant

## 2023-10-29 DIAGNOSIS — R918 Other nonspecific abnormal finding of lung field: Secondary | ICD-10-CM | POA: Diagnosis not present

## 2023-10-29 DIAGNOSIS — Z0000 Encounter for general adult medical examination without abnormal findings: Secondary | ICD-10-CM | POA: Diagnosis not present

## 2023-11-04 DIAGNOSIS — E1165 Type 2 diabetes mellitus with hyperglycemia: Secondary | ICD-10-CM | POA: Diagnosis not present

## 2023-11-04 DIAGNOSIS — I152 Hypertension secondary to endocrine disorders: Secondary | ICD-10-CM | POA: Diagnosis not present

## 2023-11-04 DIAGNOSIS — E1159 Type 2 diabetes mellitus with other circulatory complications: Secondary | ICD-10-CM | POA: Diagnosis not present

## 2023-11-04 DIAGNOSIS — Z125 Encounter for screening for malignant neoplasm of prostate: Secondary | ICD-10-CM | POA: Diagnosis not present

## 2023-11-04 DIAGNOSIS — E1143 Type 2 diabetes mellitus with diabetic autonomic (poly)neuropathy: Secondary | ICD-10-CM | POA: Diagnosis not present

## 2023-11-04 DIAGNOSIS — E782 Mixed hyperlipidemia: Secondary | ICD-10-CM | POA: Diagnosis not present

## 2023-11-25 ENCOUNTER — Other Ambulatory Visit

## 2023-11-29 ENCOUNTER — Encounter: Payer: Self-pay | Admitting: Physician Assistant

## 2023-12-01 DIAGNOSIS — Z87891 Personal history of nicotine dependence: Secondary | ICD-10-CM | POA: Diagnosis not present

## 2023-12-07 DIAGNOSIS — J439 Emphysema, unspecified: Secondary | ICD-10-CM | POA: Diagnosis not present

## 2023-12-07 DIAGNOSIS — E1122 Type 2 diabetes mellitus with diabetic chronic kidney disease: Secondary | ICD-10-CM | POA: Diagnosis not present

## 2023-12-07 DIAGNOSIS — E1165 Type 2 diabetes mellitus with hyperglycemia: Secondary | ICD-10-CM | POA: Diagnosis not present

## 2023-12-07 DIAGNOSIS — I129 Hypertensive chronic kidney disease with stage 1 through stage 4 chronic kidney disease, or unspecified chronic kidney disease: Secondary | ICD-10-CM | POA: Diagnosis not present

## 2023-12-07 DIAGNOSIS — E782 Mixed hyperlipidemia: Secondary | ICD-10-CM | POA: Diagnosis not present

## 2023-12-07 DIAGNOSIS — E1169 Type 2 diabetes mellitus with other specified complication: Secondary | ICD-10-CM | POA: Diagnosis not present

## 2023-12-07 DIAGNOSIS — Z720 Tobacco use: Secondary | ICD-10-CM | POA: Diagnosis not present

## 2023-12-09 DIAGNOSIS — J439 Emphysema, unspecified: Secondary | ICD-10-CM | POA: Diagnosis not present

## 2023-12-16 DIAGNOSIS — I2510 Atherosclerotic heart disease of native coronary artery without angina pectoris: Secondary | ICD-10-CM | POA: Diagnosis not present

## 2023-12-16 DIAGNOSIS — J439 Emphysema, unspecified: Secondary | ICD-10-CM | POA: Diagnosis not present

## 2023-12-16 DIAGNOSIS — I700 Atherosclerosis of aorta: Secondary | ICD-10-CM | POA: Diagnosis not present

## 2023-12-22 DIAGNOSIS — H52203 Unspecified astigmatism, bilateral: Secondary | ICD-10-CM | POA: Diagnosis not present

## 2023-12-22 DIAGNOSIS — H25013 Cortical age-related cataract, bilateral: Secondary | ICD-10-CM | POA: Diagnosis not present

## 2023-12-22 DIAGNOSIS — H2513 Age-related nuclear cataract, bilateral: Secondary | ICD-10-CM | POA: Diagnosis not present

## 2023-12-22 DIAGNOSIS — H5213 Myopia, bilateral: Secondary | ICD-10-CM | POA: Diagnosis not present

## 2023-12-22 DIAGNOSIS — H524 Presbyopia: Secondary | ICD-10-CM | POA: Diagnosis not present

## 2023-12-22 DIAGNOSIS — E113291 Type 2 diabetes mellitus with mild nonproliferative diabetic retinopathy without macular edema, right eye: Secondary | ICD-10-CM | POA: Diagnosis not present

## 2024-01-20 DIAGNOSIS — R5383 Other fatigue: Secondary | ICD-10-CM | POA: Diagnosis not present

## 2024-01-20 DIAGNOSIS — E785 Hyperlipidemia, unspecified: Secondary | ICD-10-CM | POA: Diagnosis not present

## 2024-01-20 DIAGNOSIS — E559 Vitamin D deficiency, unspecified: Secondary | ICD-10-CM | POA: Diagnosis not present

## 2024-01-20 DIAGNOSIS — R739 Hyperglycemia, unspecified: Secondary | ICD-10-CM | POA: Diagnosis not present

## 2024-01-26 DIAGNOSIS — I10 Essential (primary) hypertension: Secondary | ICD-10-CM | POA: Diagnosis not present

## 2024-01-26 DIAGNOSIS — G720 Drug-induced myopathy: Secondary | ICD-10-CM | POA: Diagnosis not present

## 2024-01-26 DIAGNOSIS — E782 Mixed hyperlipidemia: Secondary | ICD-10-CM | POA: Diagnosis not present

## 2024-01-26 DIAGNOSIS — E559 Vitamin D deficiency, unspecified: Secondary | ICD-10-CM | POA: Diagnosis not present

## 2024-01-26 DIAGNOSIS — Z79899 Other long term (current) drug therapy: Secondary | ICD-10-CM | POA: Diagnosis not present

## 2024-01-26 DIAGNOSIS — E1165 Type 2 diabetes mellitus with hyperglycemia: Secondary | ICD-10-CM | POA: Diagnosis not present

## 2024-01-26 DIAGNOSIS — Z789 Other specified health status: Secondary | ICD-10-CM | POA: Diagnosis not present

## 2024-03-02 DIAGNOSIS — I10 Essential (primary) hypertension: Secondary | ICD-10-CM | POA: Diagnosis not present

## 2024-03-02 DIAGNOSIS — R519 Headache, unspecified: Secondary | ICD-10-CM | POA: Diagnosis not present

## 2024-03-02 DIAGNOSIS — G43109 Migraine with aura, not intractable, without status migrainosus: Secondary | ICD-10-CM | POA: Diagnosis not present

## 2024-03-02 DIAGNOSIS — R42 Dizziness and giddiness: Secondary | ICD-10-CM | POA: Diagnosis not present

## 2024-03-02 DIAGNOSIS — E1165 Type 2 diabetes mellitus with hyperglycemia: Secondary | ICD-10-CM | POA: Diagnosis not present

## 2024-03-02 DIAGNOSIS — I951 Orthostatic hypotension: Secondary | ICD-10-CM | POA: Diagnosis not present

## 2024-03-06 DIAGNOSIS — R519 Headache, unspecified: Secondary | ICD-10-CM | POA: Diagnosis not present

## 2024-03-06 DIAGNOSIS — I10 Essential (primary) hypertension: Secondary | ICD-10-CM | POA: Diagnosis not present

## 2024-03-06 DIAGNOSIS — R42 Dizziness and giddiness: Secondary | ICD-10-CM | POA: Diagnosis not present

## 2024-03-06 DIAGNOSIS — E1165 Type 2 diabetes mellitus with hyperglycemia: Secondary | ICD-10-CM | POA: Diagnosis not present

## 2024-03-14 DIAGNOSIS — E1165 Type 2 diabetes mellitus with hyperglycemia: Secondary | ICD-10-CM | POA: Diagnosis not present

## 2024-03-14 DIAGNOSIS — E1122 Type 2 diabetes mellitus with diabetic chronic kidney disease: Secondary | ICD-10-CM | POA: Diagnosis not present

## 2024-03-14 DIAGNOSIS — I129 Hypertensive chronic kidney disease with stage 1 through stage 4 chronic kidney disease, or unspecified chronic kidney disease: Secondary | ICD-10-CM | POA: Diagnosis not present

## 2024-03-14 DIAGNOSIS — I152 Hypertension secondary to endocrine disorders: Secondary | ICD-10-CM | POA: Diagnosis not present

## 2024-03-14 DIAGNOSIS — R42 Dizziness and giddiness: Secondary | ICD-10-CM | POA: Diagnosis not present

## 2024-03-14 DIAGNOSIS — E1169 Type 2 diabetes mellitus with other specified complication: Secondary | ICD-10-CM | POA: Diagnosis not present

## 2024-03-14 DIAGNOSIS — E1159 Type 2 diabetes mellitus with other circulatory complications: Secondary | ICD-10-CM | POA: Diagnosis not present

## 2024-03-14 DIAGNOSIS — I10 Essential (primary) hypertension: Secondary | ICD-10-CM | POA: Diagnosis not present

## 2024-04-24 DIAGNOSIS — R739 Hyperglycemia, unspecified: Secondary | ICD-10-CM | POA: Diagnosis not present

## 2024-04-27 DIAGNOSIS — R42 Dizziness and giddiness: Secondary | ICD-10-CM | POA: Diagnosis not present

## 2024-04-27 DIAGNOSIS — E1165 Type 2 diabetes mellitus with hyperglycemia: Secondary | ICD-10-CM | POA: Diagnosis not present

## 2024-04-27 DIAGNOSIS — I10 Essential (primary) hypertension: Secondary | ICD-10-CM | POA: Diagnosis not present

## 2024-04-27 DIAGNOSIS — Z23 Encounter for immunization: Secondary | ICD-10-CM | POA: Diagnosis not present

## 2024-04-27 DIAGNOSIS — Z7185 Encounter for immunization safety counseling: Secondary | ICD-10-CM | POA: Diagnosis not present

## 2024-04-27 DIAGNOSIS — R519 Headache, unspecified: Secondary | ICD-10-CM | POA: Diagnosis not present

## 2024-04-27 DIAGNOSIS — F411 Generalized anxiety disorder: Secondary | ICD-10-CM | POA: Diagnosis not present

## 2024-05-02 DIAGNOSIS — R42 Dizziness and giddiness: Secondary | ICD-10-CM | POA: Diagnosis not present

## 2024-05-17 DIAGNOSIS — G4700 Insomnia, unspecified: Secondary | ICD-10-CM | POA: Diagnosis not present

## 2024-05-17 DIAGNOSIS — F331 Major depressive disorder, recurrent, moderate: Secondary | ICD-10-CM | POA: Diagnosis not present

## 2024-05-17 DIAGNOSIS — E1165 Type 2 diabetes mellitus with hyperglycemia: Secondary | ICD-10-CM | POA: Diagnosis not present

## 2024-05-17 DIAGNOSIS — I10 Essential (primary) hypertension: Secondary | ICD-10-CM | POA: Diagnosis not present

## 2024-05-17 DIAGNOSIS — R42 Dizziness and giddiness: Secondary | ICD-10-CM | POA: Diagnosis not present

## 2024-05-17 DIAGNOSIS — F411 Generalized anxiety disorder: Secondary | ICD-10-CM | POA: Diagnosis not present

## 2024-06-01 DIAGNOSIS — E1165 Type 2 diabetes mellitus with hyperglycemia: Secondary | ICD-10-CM | POA: Diagnosis not present

## 2024-06-01 DIAGNOSIS — Z7985 Long-term (current) use of injectable non-insulin antidiabetic drugs: Secondary | ICD-10-CM | POA: Diagnosis not present

## 2024-06-01 DIAGNOSIS — I152 Hypertension secondary to endocrine disorders: Secondary | ICD-10-CM | POA: Diagnosis not present

## 2024-06-01 DIAGNOSIS — E11319 Type 2 diabetes mellitus with unspecified diabetic retinopathy without macular edema: Secondary | ICD-10-CM | POA: Diagnosis not present

## 2024-06-01 DIAGNOSIS — E1159 Type 2 diabetes mellitus with other circulatory complications: Secondary | ICD-10-CM | POA: Diagnosis not present

## 2024-06-01 DIAGNOSIS — I10 Essential (primary) hypertension: Secondary | ICD-10-CM | POA: Diagnosis not present

## 2024-06-01 DIAGNOSIS — Z7984 Long term (current) use of oral hypoglycemic drugs: Secondary | ICD-10-CM | POA: Diagnosis not present

## 2024-06-01 DIAGNOSIS — F411 Generalized anxiety disorder: Secondary | ICD-10-CM | POA: Diagnosis not present

## 2024-06-01 DIAGNOSIS — R42 Dizziness and giddiness: Secondary | ICD-10-CM | POA: Diagnosis not present

## 2024-07-17 DIAGNOSIS — E1165 Type 2 diabetes mellitus with hyperglycemia: Secondary | ICD-10-CM | POA: Diagnosis not present

## 2024-07-17 DIAGNOSIS — E785 Hyperlipidemia, unspecified: Secondary | ICD-10-CM | POA: Diagnosis not present

## 2024-07-17 DIAGNOSIS — R5383 Other fatigue: Secondary | ICD-10-CM | POA: Diagnosis not present

## 2024-07-17 DIAGNOSIS — E559 Vitamin D deficiency, unspecified: Secondary | ICD-10-CM | POA: Diagnosis not present
